# Patient Record
Sex: Male | Born: 1990 | Race: Black or African American | Hispanic: No | Marital: Single | Smoking: Former smoker
Health system: Southern US, Community
[De-identification: ages and names within clinical notes are randomized; demographics above are authoritative.]

## PROBLEM LIST (undated history)

## (undated) DIAGNOSIS — I10 Essential (primary) hypertension: Secondary | ICD-10-CM

## (undated) DIAGNOSIS — E119 Type 2 diabetes mellitus without complications: Secondary | ICD-10-CM

## (undated) HISTORY — PX: NO PAST SURGERIES: SHX2092

## (undated) HISTORY — DX: Type 2 diabetes mellitus without complications: E11.9

---
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
## 2010-01-03 ENCOUNTER — Encounter (INDEPENDENT_AMBULATORY_CARE_PROVIDER_SITE_OTHER): Payer: Self-pay | Admitting: *Deleted

## 2010-05-24 ENCOUNTER — Encounter (INDEPENDENT_AMBULATORY_CARE_PROVIDER_SITE_OTHER): Payer: Self-pay | Admitting: *Deleted

## 2010-09-21 NOTE — Letter (Signed)
Summary: Appointment - Reminder 2  [facility] at [place]. [address] [place] [address]   Phone: [phone]  Fax: [phone]     Jan 03, 2010 MRN: [id number]   Stanley Ramos [address] [facility] [address] [place], [place]  [address]   Dear [name],  Our records indicate that it is time to schedule a follow-up appointment.  Dr. Daleen Squibb         recommended that you follow up with [place] in    11/2009        . It is very important that we reach you to schedule this appointment. We look forward to participating in your health care needs. Please contact us at the number listed above at your earliest convenience to schedule your appointment.  If you are unable to make an appointment at this time, give [place] a call so we can update our records.     Sincerely,   Glass blower/designer

## 2010-09-21 NOTE — Letter (Signed)
Summary: Appointment - Reminder 2  [facility] at [place]. [address], [place] [address]   Phone: [phone]  Fax: [phone]     May 24, 2010 MRN: [id number]   RIGGINS CISEK [address] [facility] [address] [place], [place]  [address]   Dear Mr. HAZELRIGG,  Our records indicate that it is time to schedule a follow-up appointment.  Dr.  Daleen Squibb        recommended that you follow up with [place] in      11/2009      . It is very important that we reach you to schedule this appointment. We look forward to participating in your health care needs. Please contact us at the number listed above at your earliest convenience to schedule your appointment.  If you are unable to make an appointment at this time, give [place] a call so we can update our records.     Sincerely,   Glass blower/designer

## 2010-11-23 LAB — GLUCOSE, CAPILLARY: Glucose-Capillary: 214 mg/dL — ABNORMAL HIGH (ref 70–99)

## 2010-11-25 LAB — GLUCOSE, CAPILLARY
Glucose-Capillary: 130 mg/dL — ABNORMAL HIGH (ref 70–99)
Glucose-Capillary: 170 mg/dL — ABNORMAL HIGH (ref 70–99)
Glucose-Capillary: 171 mg/dL — ABNORMAL HIGH (ref 70–99)
Glucose-Capillary: 179 mg/dL — ABNORMAL HIGH (ref 70–99)
Glucose-Capillary: 182 mg/dL — ABNORMAL HIGH (ref 70–99)
Glucose-Capillary: 187 mg/dL — ABNORMAL HIGH (ref 70–99)
Glucose-Capillary: 194 mg/dL — ABNORMAL HIGH (ref 70–99)
Glucose-Capillary: 209 mg/dL — ABNORMAL HIGH (ref 70–99)
Glucose-Capillary: 232 mg/dL — ABNORMAL HIGH (ref 70–99)
Glucose-Capillary: 239 mg/dL — ABNORMAL HIGH (ref 70–99)
Glucose-Capillary: 267 mg/dL — ABNORMAL HIGH (ref 70–99)
Glucose-Capillary: 276 mg/dL — ABNORMAL HIGH (ref 70–99)
Glucose-Capillary: 285 mg/dL — ABNORMAL HIGH (ref 70–99)
Glucose-Capillary: 292 mg/dL — ABNORMAL HIGH (ref 70–99)
Glucose-Capillary: 312 mg/dL — ABNORMAL HIGH (ref 70–99)
Glucose-Capillary: 325 mg/dL — ABNORMAL HIGH (ref 70–99)
Glucose-Capillary: 331 mg/dL — ABNORMAL HIGH (ref 70–99)
Glucose-Capillary: 370 mg/dL — ABNORMAL HIGH (ref 70–99)

## 2010-11-25 LAB — CBC
HCT: 49.8 % — ABNORMAL HIGH (ref 36.0–49.0)
Hemoglobin: 17.3 g/dL — ABNORMAL HIGH (ref 12.0–16.0)
MCHC: 34.8 g/dL (ref 31.0–37.0)
MCV: 87.1 fL (ref 78.0–98.0)
Platelets: 222 K/uL (ref 150–400)
RBC: 5.72 MIL/uL — ABNORMAL HIGH (ref 3.80–5.70)
RDW: 12.8 % (ref 11.4–15.5)
WBC: 8 10*3/uL (ref 4.5–13.5)

## 2010-11-25 LAB — BASIC METABOLIC PANEL
BUN: 10 mg/dL (ref 6–23)
BUN: 11 mg/dL (ref 6–23)
BUN: 12 mg/dL (ref 6–23)
CO2: 32 mEq/L (ref 19–32)
Calcium: 9.4 mg/dL (ref 8.4–10.5)
Calcium: 9.6 mg/dL (ref 8.4–10.5)
Chloride: 97 mEq/L (ref 96–112)
Creatinine, Ser: 1.01 mg/dL (ref 0.4–1.5)
Glucose, Bld: 157 mg/dL — ABNORMAL HIGH (ref 70–99)
Glucose, Bld: 324 mg/dL — ABNORMAL HIGH (ref 70–99)
Potassium: 3.6 mEq/L (ref 3.5–5.1)
Potassium: 4.8 mEq/L (ref 3.5–5.1)
Sodium: 133 mEq/L — ABNORMAL LOW (ref 135–145)
Sodium: 138 mEq/L (ref 135–145)
Sodium: 143 mEq/L (ref 135–145)

## 2010-11-25 LAB — URINALYSIS, ROUTINE W REFLEX MICROSCOPIC
Bilirubin Urine: NEGATIVE
Glucose, UA: >1000 mg/dL — AB
Ketones, ur: 40 mg/dL — AB
Leukocytes, UA: NEGATIVE
Nitrite: NEGATIVE
Protein, ur: 100 mg/dL — AB
Specific Gravity, Urine: 1.02 (ref 1.005–1.030)
Urobilinogen, UA: 0.2 mg/dL (ref 0.0–1.0)
pH: 5.5 (ref 5.0–8.0)

## 2010-11-25 LAB — DIFFERENTIAL
Basophils Absolute: 0 K/uL (ref 0.0–0.1)
Basophils Relative: 0 % (ref 0–1)
Eosinophils Absolute: 0 K/uL (ref 0.0–1.2)
Eosinophils Relative: 0 % (ref 0–5)
Lymphocytes Relative: 23 % — ABNORMAL LOW (ref 24–48)
Lymphs Abs: 1.8 10*3/uL (ref 1.1–4.8)
Monocytes Absolute: 0.6 10*3/uL (ref 0.2–1.2)
Monocytes Relative: 8 % (ref 3–11)
Neutro Abs: 5.5 10*3/uL (ref 1.7–8.0)
Neutrophils Relative %: 69 % (ref 43–71)

## 2010-11-25 LAB — ETHANOL: Alcohol, Ethyl (B): <5 mg/dL (ref 0–10)

## 2010-11-25 LAB — RAPID URINE DRUG SCREEN, HOSP PERFORMED
Amphetamines: NOT DETECTED
Barbiturates: NOT DETECTED
Benzodiazepines: NOT DETECTED
Cocaine: NOT DETECTED
Opiates: NOT DETECTED
Tetrahydrocannabinol: NOT DETECTED

## 2010-11-25 LAB — BASIC METABOLIC PANEL WITH GFR
CO2: 25 meq/L (ref 19–32)
Calcium: 10.3 mg/dL (ref 8.4–10.5)
Creatinine, Ser: 1.23 mg/dL (ref 0.4–1.5)
Glucose, Bld: 494 mg/dL — ABNORMAL HIGH (ref 70–99)

## 2010-11-25 LAB — URINE MICROSCOPIC-ADD ON

## 2010-11-25 LAB — KETONES, QUALITATIVE
Acetone, Bld: NEGATIVE
Acetone, Bld: NEGATIVE

## 2010-11-25 LAB — HEMOGLOBIN A1C: Hgb A1c MFr Bld: 13.1 % — ABNORMAL HIGH (ref 4.6–6.1)

## 2010-11-25 LAB — CK: Total CK: 379 U/L — ABNORMAL HIGH (ref 7–232)

## 2011-01-02 NOTE — Assessment & Plan Note (Signed)
[facility]                       [place] [facility] OFFICE NOTE   Stanley Ramos, Stanley Ramos                     MRN:          [id number]  DATE:05/11/2009                            DOB:          1991-06-20    Stanley Ramos comes in today for followup.  He has lost an additional 2  pounds of total about 8-10 pounds for the last 6 weeks since we  initially evaluated him.  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   He recently saw Dr. Romero Belling of our Endocrinology Division of  [facility].  He is now monitoring his blood sugar much more closely, but  still he is running around 150-210.  His highest numbers appear to be in  the morning.   His blood pressure is under good control, but his heart rate still in  the 70s.   He is anxious to get back playing football, but I told him it was too  early.  We really need to see progressive improvement, not to mention  objective assessment of his LV systolic function improving, which  probably will take up to 3-6 months under the best of circumstances.   CURRENT MEDICATIONS:  1. Metformin 1000 mg p.o. b.i.d.  2. Lantus 50 units at bedtime.  3. Enalapril 20 mg p.o. daily.  4. Carvedilol 12.5 mg b.i.d.  5. NovoLog 70/30 70-30% suspension 70 units in the morning and 40      units in the evening.   He is intolerant of PENICILLIN.   He denies any orthopnea, PND, or peripheral edema.  His dyspnea on  exertion is stable.  He is not doing any lifting or heavy workouts.   His blood pressure today is 126/82, pulse 76 and regular, and he weighs  314.  HEENT:  Unchanged.  NECK:  No obvious JVD, but he has a thick neck.  LUNGS:  Clear to auscultation and percussion.  HEART:  Poorly appreciated PMIs, very muscular.  Normal S1 and S2.  No  gallop.  ABDOMEN:  Protuberant.  Good bowel sounds.  EXTREMITIES:  No edema.  Pulses are intact.  NEUROLOGIC:   Intact.   ASSESSMENT AND PLAN:  Lytle is slowly getting his risk factors under  control, but we can certainly do better with weight reduction not to  mention blood sugar control.  I am delighted he is seeing Dr. Everardo All.  He still receiving nutritional counseling at his school.  We have  increased his carvedilol to 25 b.i.d. for reduction of his resting heart  rate and for improving the chances of LV recovery.   Again, he really wants to play football, but I think it is way too early  to release him.  This is in his best interest in his healthcare future.   PLAN:  1. Increase carvedilol to 25 b.i.d.  2. Tighter him blood sugar control.  3. Lose 3-4 pounds per month.  4. Follow up with me end of November.  We will obtain a 2-D      echocardiogram prior to that visit.  Hopefully, his LV has  recovered substantially.  If that is the case, we will release him      to the begin working out and getting him conditioned to play sports      in the near future.     Thomas C. Daleen Squibb, MD, [facility]  Electronically Signed    TCW/MedQ  DD: 05/11/2009  DT: 05/12/2009  Job #: [id number]   cc:   Lorin Picket A. Gerda Diss, MD

## 2011-01-02 NOTE — H&P (Signed)
NAME:  Stanley Ramos, Stanley Ramos              ACCOUNT NO.:  [id number]   MEDICAL RECORD NO.:  [id number]          PATIENT TYPE:  OBV   LOCATION:  A334                          FACILITY:  [facility]   PHYSICIAN:  Scott A. Gerda Diss, MD    DATE OF BIRTH:  [birth year]   DATE OF ADMISSION:  03/21/2009  DATE OF DISCHARGE:  LH                              HISTORY & PHYSICAL   CHIEF COMPLAINT:  Syncope.   HISTORY OF PRESENT ILLNESS:  This 20 year old was practicing football.  He is a known diabetic, diagnosed in 2004.  He is on insulin and was in  the huddle, felt hot, felt nauseous and dizzy and then passed out.  He  was brought to the emergency department, his sugars were in the 400s.  Head scan was done which was negative.  Blood work including ketones was  done, essentially showed a little  hemoconcentration along with  increased urine specific gravity along with glucoses in the 400s.  He  was treated with IV insulin, also treated with IV fluids and was  recommended to be admitted.   PAST MEDICAL HISTORY:  Normal birth history.  Up-to-date on shots.  Diagnosed in 2004 with diabetes.  Apparently was about 300 pounds at  that time; has lost some weight and he is grown a lot in terms of  height.  He plays football on a regular basis, entering into 11th grade.  He stays at home with his mom who is also diabetic.   MEDICATIONS:  Lantus 50 units at night, 30 units of 70/30 in the morning  and sliding scale at meal times, metformin 1000 mg b.i.d.,  as well as  enalapril.   FAMILY HISTORY:  Diabetes.   REVIEW OF SYSTEMS:  See per above.  Denies any chest pain, shortness of  breath, previous syncope.   PHYSICAL EXAMINATION:  HEENT:  NLT, NL.  NECK:  No masses.  CHEST:  CTA.  HEART: Regular.  ABDOMEN: Soft.  EXTREMITIES:  No edema.   CT scan reviewed, x-rays were reviewed, lab work was reviewed.  Lab work  did not show any serum ketones.  Drug screen was negative.  Urine  microscopic showed a specific  gravity 1.020, 40 of ketones, urine  glucose greater than 1000.  EtOH level negative.  BMET:  494 glucose,  sodium 133.  White count 8000.   ASSESSMENT AND PLAN:  Hyperglycemia with syncope.  I think that is the  patient probably got overheated that caused the patient to pass out and  cause the numbers go up dramatically.  I do think that it would be best  for this patient to be admitted, given IV fluids and monitored closely.  Will consider doing an echo as well.      Scott A. Gerda Diss, MD  Electronically Signed     SAL/MEDQ  D:  03/22/2009  T:  03/22/2009  Job:  [id number]

## 2011-01-02 NOTE — Group Therapy Note (Signed)
NAME:  Stanley Ramos, Stanley Ramos              ACCOUNT NO.:  [id number]   MEDICAL RECORD NO.:  [id number]          PATIENT TYPE:  OBV   LOCATION:  A334                          FACILITY:  [facility]   PHYSICIAN:  Scott A. Gerda Diss, MD    DATE OF BIRTH:  November 18, 1990   DATE OF PROCEDURE:  DATE OF DISCHARGE:  03/22/2009                                 PROGRESS NOTE   At 8:00 a.m., the patient was more alert with feeling better.  His  glucoses are now stabilized.  We will bring him off the insulin drip,  place him on 30 units of Lantus in a sliding scale and get him back  toward his usual dietary intake.  In addition to this, we will go ahead  with the patient have an echo done and if he is doing well this  afternoon, I think we will let him go home.  I did advise him not only a  followup in [facility], but have them set him up with  endocrinologist mainly because with him playing competitive football, I  really think he has to start getting in to checking his sugars before  practice and if it is a long practice or if he starts feeling bad, check  it during practice and check it afterwards.  I want him to try to  maintain his glycemic control as possible and also keep well hydrated.  He is not to do any contact football for the rest of this week and would  not be released to contact football until Monday next week as long as he  has a good rest of the week.  He understands this.  He may do some  conditioning this week as he starts to feel better.      Scott A. Gerda Diss, MD  Electronically Signed     SAL/MEDQ  D:  03/22/2009  T:  03/22/2009  Job:  [id number]

## 2011-02-27 ENCOUNTER — Encounter: Payer: Self-pay | Admitting: Cardiology

## 2011-09-18 ENCOUNTER — Emergency Department (HOSPITAL_COMMUNITY)
Admission: EM | Admit: 2011-09-18 | Discharge: 2011-09-18 | Disposition: A | Discharge status: Home / Self Care | Payer: Self-pay | Attending: Emergency Medicine | Admitting: Emergency Medicine

## 2011-09-18 DIAGNOSIS — Z794 Long term (current) use of insulin: Secondary | ICD-10-CM | POA: Insufficient documentation

## 2011-09-18 DIAGNOSIS — IMO0002 Reserved for concepts with insufficient information to code with codable children: Secondary | ICD-10-CM

## 2011-09-18 DIAGNOSIS — L03019 Cellulitis of unspecified finger: Secondary | ICD-10-CM | POA: Insufficient documentation

## 2011-09-18 DIAGNOSIS — E119 Type 2 diabetes mellitus without complications: Secondary | ICD-10-CM | POA: Insufficient documentation

## 2011-09-18 MED ORDER — CEPHALEXIN 500 MG PO CAPS: 500.0000 mg | ORAL_CAPSULE | Freq: Four times a day (QID) | ORAL | Status: AC

## 2011-09-18 NOTE — ED Notes (Signed)
Pt reports redness /pain and slight swelling to left third finger around nail

## 2011-09-18 NOTE — ED Notes (Signed)
Pt alert & oriented x4, stable gait. Pt given discharge instructions, paperwork & prescription(s), pt verbalized understanding. Pt left department w/ no further questions.  

## 2011-09-19 NOTE — ED Provider Notes (Addendum)
History     CSN: [id number]  Arrival date & time 09/18/11  2124   First MD Initiated Contact with Patient 09/18/11 2147      Chief Complaint  Patient presents with  . Hand Pain    (Consider location/radiation/quality/duration/timing/severity/associated sxs/prior treatment) Patient is a 21 y.o. male presenting with hand pain. The history is provided by the patient.  Hand Pain This is a new problem. The current episode started 2 days ago. The problem occurs constantly. The problem has been gradually worsening. Exacerbated by: palpation. He has tried nothing for the symptoms.    Past Medical History  Diagnosis Date  . DM (diabetes mellitus)     No past surgical history on file.  Family History  Problem Relation Age of Onset  . Diabetes      family hx (not immediate) of DM     History  Substance Use Topics  . Smoking status: Never Smoker   . Smokeless tobacco: Not on file  . Alcohol Use: No      Review of Systems  All other systems reviewed and are negative.    Allergies  Penicillins  Home Medications   Current Outpatient Rx  Name Route Sig Dispense Refill  . CARVEDILOL 25 MG PO TABS Oral Take 25 mg by mouth 2 (two) times daily.      . ENALAPRIL MALEATE 20 MG PO TABS Oral Take 20 mg by mouth daily.      . INSULIN ASPART PROT & ASPART (70-30) 100 UNIT/ML [place] SUSP Subcutaneous Inject into the skin. 70 units in am and 45 units pm     . INSULIN GLARGINE 100 UNIT/ML  SOLN Subcutaneous Inject 50-55 Units into the skin at bedtime.     [place] Kitchen METFORMIN HCL 1000 MG PO TABS Oral Take 1,000 mg by mouth 2 (two) times daily.      . CEPHALEXIN 500 MG PO CAPS Oral Take 1 capsule (500 mg total) by mouth 4 (four) times daily. 28 capsule 0    BP 164/98  Pulse 97  Temp(Src) 98.2 F (36.8 C) (Oral)  Resp 20  Ht 6\' 3"  (1.905 m)  Wt 310 lb (140.615 kg)  BMI 38.75 kg/m2  SpO2 98%  Physical Exam  Nursing note and vitals reviewed. Constitutional: He is oriented to person,  place, and time. He appears well-developed and well-nourished.  HENT:  Head: Normocephalic and atraumatic.  Neck: Normal range of motion. Neck supple.  Musculoskeletal:        There is swelling, erythema of the base of the nail of the left third finger.  Neurological: He is alert and oriented to person, place, and time.  Skin: Skin is warm and dry.    ED Course  Procedures (including critical care time)  Labs Reviewed - No data to display No results found.   1. Paronychia       MDM  Will prescribe keflex, frequent hot soaks.          Geoffery Lyons, MD 09/19/11 4098  Geoffery Lyons, MD 10/09/11 1540

## 2012-06-18 ENCOUNTER — Emergency Department (HOSPITAL_COMMUNITY): Payer: No Typology Code available for payment source

## 2012-06-18 ENCOUNTER — Observation Stay (HOSPITAL_COMMUNITY)
Admission: EM | Admit: 2012-06-18 | Discharge: 2012-06-20 | Disposition: A | Discharge status: Home / Self Care | Payer: No Typology Code available for payment source | Attending: General Surgery | Admitting: General Surgery

## 2012-06-18 ENCOUNTER — Encounter (HOSPITAL_COMMUNITY): Payer: Self-pay | Admitting: *Deleted

## 2012-06-18 DIAGNOSIS — IMO0002 Reserved for concepts with insufficient information to code with codable children: Secondary | ICD-10-CM | POA: Insufficient documentation

## 2012-06-18 DIAGNOSIS — E119 Type 2 diabetes mellitus without complications: Secondary | ICD-10-CM | POA: Insufficient documentation

## 2012-06-18 DIAGNOSIS — S27321A Contusion of lung, unilateral, initial encounter: Secondary | ICD-10-CM

## 2012-06-18 DIAGNOSIS — I10 Essential (primary) hypertension: Secondary | ICD-10-CM | POA: Insufficient documentation

## 2012-06-18 DIAGNOSIS — S27329A Contusion of lung, unspecified, initial encounter: Principal | ICD-10-CM | POA: Insufficient documentation

## 2012-06-18 DIAGNOSIS — T07XXXA Unspecified multiple injuries, initial encounter: Secondary | ICD-10-CM | POA: Diagnosis present

## 2012-06-18 HISTORY — DX: Type 2 diabetes mellitus without complications: E11.9

## 2012-06-18 HISTORY — DX: Essential (primary) hypertension: I10

## 2012-06-18 LAB — URINALYSIS, MICROSCOPIC ONLY
Leukocytes, UA: NEGATIVE
Protein, ur: 100 mg/dL — AB
Urobilinogen, UA: 0.2 mg/dL (ref 0.0–1.0)

## 2012-06-18 LAB — COMPREHENSIVE METABOLIC PANEL
ALT: 85 U/L — ABNORMAL HIGH (ref 0–53)
Alkaline Phosphatase: 95 U/L (ref 39–117)
CO2: 24 mEq/L (ref 19–32)
GFR calc Af Amer: >90 mL/min (ref >90)
GFR calc non Af Amer: >90 mL/min (ref >90)
Glucose, Bld: 334 mg/dL — ABNORMAL HIGH (ref 70–99)
Potassium: 3.6 mEq/L (ref 3.5–5.1)
Sodium: 131 mEq/L — ABNORMAL LOW (ref 135–145)
Total Bilirubin: 0.5 mg/dL (ref 0.3–1.2)

## 2012-06-18 LAB — CREATININE, SERUM
Creatinine, Ser: 0.94 mg/dL (ref 0.50–1.35)
GFR calc Af Amer: >90 mL/min (ref >90)
GFR calc non Af Amer: >90 mL/min (ref >90)

## 2012-06-18 LAB — TYPE AND SCREEN
ABO/RH(D): A POS
Antibody Screen: NEGATIVE

## 2012-06-18 LAB — POCT I-STAT, CHEM 8
BUN: 14 mg/dL (ref 6–23)
Sodium: 139 mEq/L (ref 135–145)
TCO2: 22 mmol/L (ref 0–100)

## 2012-06-18 LAB — GLUCOSE, CAPILLARY

## 2012-06-18 LAB — ABO/RH: ABO/RH(D): A POS

## 2012-06-18 LAB — CBC
MCHC: 35.4 g/dL (ref 30.0–36.0)
MCV: 89.5 fL (ref 78.0–100.0)
Platelets: 213 10*3/uL (ref 150–400)
RDW: 12.2 % (ref 11.5–15.5)
RDW: 12.4 % (ref 11.5–15.5)
WBC: 13.4 10*3/uL — ABNORMAL HIGH (ref 4.0–10.5)

## 2012-06-18 LAB — PROTIME-INR: INR: 1 (ref 0.00–1.49)

## 2012-06-18 MED ORDER — DOCUSATE SODIUM 100 MG PO CAPS
100.0000 mg | ORAL_CAPSULE | Freq: Two times a day (BID) | ORAL | Status: DC
  Administered 2012-06-18 – 2012-06-19 (×3): 100 mg via ORAL
  Filled 2012-06-18 (×4): qty 1

## 2012-06-18 MED ORDER — ONDANSETRON HCL 4 MG PO TABS: 4.0000 mg | ORAL_TABLET | Freq: Four times a day (QID) | ORAL | Status: DC | PRN

## 2012-06-18 MED ORDER — OXYCODONE HCL 5 MG PO TABS: 2.5000 mg | ORAL_TABLET | ORAL | Status: DC | PRN

## 2012-06-18 MED ORDER — BACITRACIN ZINC 500 UNIT/GM EX OINT
TOPICAL_OINTMENT | Freq: Two times a day (BID) | CUTANEOUS | Status: DC
  Administered 2012-06-18 – 2012-06-19 (×3): via TOPICAL
  Filled 2012-06-18: qty 15

## 2012-06-18 MED ORDER — POTASSIUM CHLORIDE IN NACL 20-0.45 MEQ/L-% IV SOLN
INTRAVENOUS | Status: DC
  Administered 2012-06-18 – 2012-06-19 (×2): via INTRAVENOUS
  Filled 2012-06-18 (×5): qty 1000

## 2012-06-18 MED ORDER — INSULIN ASPART 100 UNIT/ML ~~LOC~~ SOLN
0.0000 [IU] | Freq: Three times a day (TID) | SUBCUTANEOUS | Status: DC
  Administered 2012-06-18: 7 [IU] via SUBCUTANEOUS
  Administered 2012-06-18: 11 [IU] via SUBCUTANEOUS
  Administered 2012-06-19: 7 [IU] via SUBCUTANEOUS
  Administered 2012-06-19 (×2): 4 [IU] via SUBCUTANEOUS
  Administered 2012-06-20: 7 [IU] via SUBCUTANEOUS

## 2012-06-18 MED ORDER — ENOXAPARIN SODIUM 40 MG/0.4ML ~~LOC~~ SOLN
40.0000 mg | SUBCUTANEOUS | Status: DC
  Administered 2012-06-18 – 2012-06-19 (×2): 40 mg via SUBCUTANEOUS
  Filled 2012-06-18 (×3): qty 0.4

## 2012-06-18 MED ORDER — HYDROMORPHONE HCL PF 1 MG/ML IJ SOLN
1.0000 mg | INTRAMUSCULAR | Status: DC | PRN
  Administered 2012-06-18: 1 mg via INTRAVENOUS
  Filled 2012-06-18: qty 1

## 2012-06-18 MED ORDER — INSULIN ASPART 100 UNIT/ML ~~LOC~~ SOLN
0.0000 [IU] | Freq: Every day | SUBCUTANEOUS | Status: DC
  Administered 2012-06-18: 3 [IU] via SUBCUTANEOUS

## 2012-06-18 MED ORDER — IOHEXOL 300 MG/ML  SOLN
100.0000 mL | Freq: Once | INTRAMUSCULAR | Status: AC | PRN
  Administered 2012-06-18: 100 mL via INTRAVENOUS

## 2012-06-18 MED ORDER — PANTOPRAZOLE SODIUM 40 MG IV SOLR
40.0000 mg | Freq: Every day | INTRAVENOUS | Status: DC
  Filled 2012-06-18 (×2): qty 40

## 2012-06-18 MED ORDER — SODIUM CHLORIDE 0.9 % IV SOLN: Freq: Once | INTRAVENOUS | Status: DC

## 2012-06-18 MED ORDER — INSULIN GLARGINE 100 UNIT/ML ~~LOC~~ SOLN
40.0000 [IU] | Freq: Every day | SUBCUTANEOUS | Status: DC
  Administered 2012-06-18 – 2012-06-19 (×2): 40 [IU] via SUBCUTANEOUS

## 2012-06-18 MED ORDER — OXYCODONE HCL 5 MG PO TABS
10.0000 mg | ORAL_TABLET | ORAL | Status: DC | PRN
  Administered 2012-06-18 – 2012-06-19 (×2): 10 mg via ORAL
  Filled 2012-06-18 (×2): qty 2

## 2012-06-18 MED ORDER — ONDANSETRON HCL 4 MG/2ML IJ SOLN: 4.0000 mg | Freq: Four times a day (QID) | INTRAMUSCULAR | Status: DC | PRN

## 2012-06-18 MED ORDER — HYDROMORPHONE HCL PF 1 MG/ML IJ SOLN
1.0000 mg | INTRAMUSCULAR | Status: DC | PRN
  Administered 2012-06-18 (×2): 2 mg via INTRAVENOUS
  Administered 2012-06-19: 1 mg via INTRAVENOUS
  Filled 2012-06-18 (×3): qty 1
  Filled 2012-06-18: qty 2

## 2012-06-18 MED ORDER — TETANUS-DIPHTH-ACELL PERTUSSIS 5-2.5-18.5 LF-MCG/0.5 IM SUSP
0.5000 mL | Freq: Once | INTRAMUSCULAR | Status: AC
  Administered 2012-06-18: 0.5 mL via INTRAMUSCULAR
  Filled 2012-06-18: qty 0.5

## 2012-06-18 MED ORDER — PANTOPRAZOLE SODIUM 40 MG PO TBEC
40.0000 mg | DELAYED_RELEASE_TABLET | Freq: Every day | ORAL | Status: DC
  Administered 2012-06-18: 40 mg via ORAL
  Filled 2012-06-18: qty 1

## 2012-06-18 MED ORDER — OXYCODONE HCL 5 MG PO TABS: 5.0000 mg | ORAL_TABLET | ORAL | Status: DC | PRN

## 2012-06-18 NOTE — Progress Notes (Signed)
UR complete 

## 2012-06-18 NOTE — ED Notes (Signed)
Pt returned from CT °

## 2012-06-18 NOTE — ED Notes (Signed)
Pt arrived to CT, trauma MD at bedside.
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
## 2012-06-18 NOTE — Progress Notes (Signed)
Patient looking very uncomfortable.  Lots of pain.  Diaphoretic and complaining of lots of of pain on his right side.  Will get PT consultation, IV pain medications, and incentive spirometer.  Marta Lamas. Gae Bon, MD, FACS [phone] Trauma Surgeon

## 2012-06-18 NOTE — ED Notes (Signed)
EDP and Trauma MD at bedside to asses pt.

## 2012-06-18 NOTE — ED Notes (Signed)
Report called to floor. Pt remains alert and oriented, family at bedside
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
## 2012-06-18 NOTE — ED Notes (Signed)
Abrasions to right upper arm, right lower leg, left knee and left elbow cleaned with normal saline and antibiotic ointment applied and wrapped covered with sterile dresssings and kerlix

## 2012-06-18 NOTE — ED Notes (Signed)
Family at the bedside.
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
## 2012-06-19 ENCOUNTER — Observation Stay (HOSPITAL_COMMUNITY): Payer: No Typology Code available for payment source

## 2012-06-19 DIAGNOSIS — E119 Type 2 diabetes mellitus without complications: Secondary | ICD-10-CM | POA: Insufficient documentation

## 2012-06-19 DIAGNOSIS — I10 Essential (primary) hypertension: Secondary | ICD-10-CM | POA: Insufficient documentation

## 2012-06-19 LAB — URINE CULTURE: Colony Count: >=100000

## 2012-06-19 LAB — GLUCOSE, CAPILLARY
Glucose-Capillary: 184 mg/dL — ABNORMAL HIGH (ref 70–99)
Glucose-Capillary: 168 mg/dL — ABNORMAL HIGH (ref 70–99)
Glucose-Capillary: 176 mg/dL — ABNORMAL HIGH (ref 70–99)

## 2012-06-19 MED ORDER — OXYCODONE-ACETAMINOPHEN 10-325 MG PO TABS: 1.0000 | ORAL_TABLET | ORAL | Status: AC | PRN

## 2012-06-19 MED ORDER — HYDROMORPHONE HCL PF 1 MG/ML IJ SOLN: 0.5000 mg | INTRAMUSCULAR | Status: DC | PRN

## 2012-06-19 MED ORDER — NAPROXEN 500 MG PO TABS: 500.0000 mg | ORAL_TABLET | Freq: Two times a day (BID) | ORAL | Status: DC

## 2012-06-19 MED ORDER — NAPROXEN 500 MG PO TABS
500.0000 mg | ORAL_TABLET | Freq: Two times a day (BID) | ORAL | Status: DC
  Administered 2012-06-19 – 2012-06-20 (×2): 500 mg via ORAL
  Filled 2012-06-19 (×4): qty 1

## 2012-06-19 MED ORDER — OXYCODONE HCL 5 MG PO TABS
5.0000 mg | ORAL_TABLET | ORAL | Status: DC | PRN
  Administered 2012-06-19: 15 mg via ORAL
  Administered 2012-06-19 – 2012-06-20 (×2): 10 mg via ORAL
  Filled 2012-06-19: qty 2
  Filled 2012-06-19: qty 1
  Filled 2012-06-19: qty 3
  Filled 2012-06-19: qty 1

## 2012-06-19 NOTE — Discharge Summary (Signed)
Desiree Daise, MD, MPH, FACS Pager: [phone]  
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
## 2012-06-19 NOTE — Progress Notes (Signed)
Up walking in room but still quite sore.  Will see how therapies go today and likely plan D/C this PM. Patient examined and I agree with the assessment and plan  Violeta Gelinas, MD, MPH, FACS Pager: [phone]  06/19/2012 9:47 AM

## 2012-06-19 NOTE — Progress Notes (Signed)
Inpatient Diabetes Program Recommendations  AACE/ADA: New Consensus Statement on Inpatient Glycemic Control (2013)  Target Ranges:  Prepandial:   less than 140 mg/dL      Peak postprandial:   less than 180 mg/dL (1-2 hours)      Critically ill patients:  140 - 180 mg/dL   Reason for Visit: Hyperglycemia  Inpatient Diabetes Program Recommendations Insulin - Basal: If not ordering any home 70/30, would suggest tht Lantus be increased to at least home dose of50 units at HS Insulin - Meal Coverage: If not going to use 70/30, please add meal covearge 4 -6  units tidwc  Note: Thank you, Lenor Coffin, RN, CNS, Diabetes Coordinator [phone])

## 2012-06-20 ENCOUNTER — Encounter: Payer: Self-pay | Admitting: Cardiology

## 2012-06-20 LAB — GLUCOSE, CAPILLARY: Glucose-Capillary: 240 mg/dL — ABNORMAL HIGH (ref 70–99)

## 2012-06-20 NOTE — Progress Notes (Signed)
Discharge instructions/Med Rec Sheet reviewed w/ pt. Pt expressed understanding and copies given w/ prescriptions. Pt d/c'd in stable condition via w/c, accompanied by discharge volunteers 

## 2012-06-23 ENCOUNTER — Encounter (INDEPENDENT_AMBULATORY_CARE_PROVIDER_SITE_OTHER): Payer: Self-pay | Admitting: Orthopedic Surgery

## 2012-06-23 ENCOUNTER — Telehealth (HOSPITAL_COMMUNITY): Payer: Self-pay | Admitting: Orthopedic Surgery

## 2012-06-23 NOTE — Telephone Encounter (Signed)
Patient called for letter to return to work. He still cannot move his right shoulder but needs to return to work for the money. He is trying to go to PT but lacks the money for that. After some discussion I agreed to write a return to work note with the observation that he currently can't use his right arm and will leave it to his employer to decide if he can come back with that restriction.
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
## 2013-09-01 ENCOUNTER — Encounter: Payer: Self-pay | Admitting: Family Medicine

## 2013-09-01 ENCOUNTER — Ambulatory Visit (INDEPENDENT_AMBULATORY_CARE_PROVIDER_SITE_OTHER): Payer: 59 | Admitting: Family Medicine

## 2013-09-01 VITALS — BP 130/84 | Ht 75.0 in | Wt 312.0 lb

## 2013-09-01 DIAGNOSIS — E119 Type 2 diabetes mellitus without complications: Secondary | ICD-10-CM

## 2013-09-01 DIAGNOSIS — Z79899 Other long term (current) drug therapy: Secondary | ICD-10-CM

## 2013-09-01 DIAGNOSIS — Z0000 Encounter for general adult medical examination without abnormal findings: Secondary | ICD-10-CM

## 2013-09-01 DIAGNOSIS — I10 Essential (primary) hypertension: Secondary | ICD-10-CM

## 2013-09-01 LAB — POCT GLYCOSYLATED HEMOGLOBIN (HGB A1C): HEMOGLOBIN A1C: 8.3

## 2013-09-01 MED ORDER — INSULIN ASPART PROT & ASPART (70-30 MIX) 100 UNIT/ML ~~LOC~~ SUSP: 10.0000 [IU] | Freq: Three times a day (TID) | SUBCUTANEOUS | Status: DC

## 2013-09-01 MED ORDER — INSULIN GLARGINE 100 UNIT/ML ~~LOC~~ SOLN: 50.0000 [IU] | Freq: Every day | SUBCUTANEOUS | Status: DC

## 2013-09-01 MED ORDER — METFORMIN HCL 1000 MG PO TABS: 1000.0000 mg | ORAL_TABLET | Freq: Two times a day (BID) | ORAL | Status: DC

## 2013-09-01 MED ORDER — ENALAPRIL MALEATE 10 MG PO TABS: 10.0000 mg | ORAL_TABLET | Freq: Every day | ORAL | Status: DC

## 2013-09-01 NOTE — Progress Notes (Signed)
   Subjective:    Patient ID: Stanley Ramos, male    DOB: December 03, 1990, 23 y.o.   MRN: [id number]  HPI Patient is here today for a physical. He has not been here in over a year. He states he stopped using his medications over the past several months a separate insulin but now he started to use his medicines again he needs refills 7.  He has a new insurance and would like to start coming back here for diabetic checkups.  He needs a DOT form filled out. [facility] there is no reason he cannot drive. Form was filled out without difficulty. Yearly examinations recommended.    Review of Systems  Constitutional: Negative for fever, activity change and appetite change.  HENT: Negative for congestion and rhinorrhea.   Eyes: Negative for discharge.  Respiratory: Negative for cough and wheezing.   Cardiovascular: Negative for chest pain.  Gastrointestinal: Negative for vomiting, abdominal pain and blood in stool.  Genitourinary: Negative for frequency and difficulty urinating.  Musculoskeletal: Negative for neck pain.  Skin: Negative for rash.  Allergic/Immunologic: Negative for environmental allergies and food allergies.  Neurological: Negative for weakness and headaches.  Psychiatric/Behavioral: Negative for agitation.       Objective:   Physical Exam  Constitutional: He appears well-developed and well-nourished.  HENT:  Head: Normocephalic and atraumatic.  Right Ear: External ear normal.  Left Ear: External ear normal.  Nose: Nose normal.  Mouth/Throat: Oropharynx is clear and moist.  Eyes: EOM are normal. Pupils are equal, round, and reactive to light.  Neck: Normal range of motion. Neck supple. No thyromegaly present.  Cardiovascular: Normal rate, regular rhythm and normal heart sounds.   No murmur heard. Pulmonary/Chest: Effort normal and breath sounds normal. No respiratory distress. He has no wheezes.  Abdominal: Soft. Bowel sounds are  normal. He exhibits no distension and no mass. There is no tenderness.  Genitourinary: Penis normal.  Musculoskeletal: Normal range of motion. He exhibits no edema.  Lymphadenopathy:    He has no cervical adenopathy.  Neurological: He is alert. He exhibits normal muscle tone.  Skin: Skin is warm and dry. No erythema.  Psychiatric: He has a normal mood and affect. His behavior is normal. Judgment normal.          Assessment & Plan:  Wellness-safety measures dietary measures discussed the importance trying to lose weight watch diet closely  #2 diabetes subpar control the importance of getting back on medicines. NovoLog 10-15 units per meal plus Lantus long-acting insulin 50 units titrate up toward 80 try to get fasting glucose down to 110-120. Importance of getting back on other medicines as well. These were sent in. He'll followup in 3-4 months to recheck A1c. If not getting down towards 7.0 eventually then he will need referral to endocrinology. This was offered to the patient. He would like to followup here and initially because of cost  This patient has not been on beta blockers over the past several years I don't recommend reinitiating them at this time. Lab work was ordered we await the results patient works third shift which makes controlling diabetes challenging but he is motivated to get it under better control

## 2013-09-15 ENCOUNTER — Emergency Department (HOSPITAL_COMMUNITY): Payer: 59

## 2013-09-15 ENCOUNTER — Encounter (HOSPITAL_COMMUNITY): Payer: Self-pay | Admitting: Emergency Medicine

## 2013-09-15 ENCOUNTER — Emergency Department (HOSPITAL_COMMUNITY)
Admission: EM | Admit: 2013-09-15 | Discharge: 2013-09-16 | Disposition: A | Discharge status: Home / Self Care | Payer: 59 | Attending: Emergency Medicine | Admitting: Emergency Medicine

## 2013-09-15 DIAGNOSIS — Y92838 Other recreation area as the place of occurrence of the external cause: Secondary | ICD-10-CM

## 2013-09-15 DIAGNOSIS — S99929A Unspecified injury of unspecified foot, initial encounter: Principal | ICD-10-CM

## 2013-09-15 DIAGNOSIS — Y9367 Activity, basketball: Secondary | ICD-10-CM | POA: Insufficient documentation

## 2013-09-15 DIAGNOSIS — Z880 Allergy status to penicillin: Secondary | ICD-10-CM | POA: Insufficient documentation

## 2013-09-15 DIAGNOSIS — I10 Essential (primary) hypertension: Secondary | ICD-10-CM | POA: Insufficient documentation

## 2013-09-15 DIAGNOSIS — S93336A Other dislocation of unspecified foot, initial encounter: Secondary | ICD-10-CM | POA: Insufficient documentation

## 2013-09-15 DIAGNOSIS — S8990XA Unspecified injury of unspecified lower leg, initial encounter: Secondary | ICD-10-CM | POA: Insufficient documentation

## 2013-09-15 DIAGNOSIS — Z87891 Personal history of nicotine dependence: Secondary | ICD-10-CM | POA: Insufficient documentation

## 2013-09-15 DIAGNOSIS — Z79899 Other long term (current) drug therapy: Secondary | ICD-10-CM | POA: Insufficient documentation

## 2013-09-15 DIAGNOSIS — X58XXXA Exposure to other specified factors, initial encounter: Secondary | ICD-10-CM | POA: Insufficient documentation

## 2013-09-15 DIAGNOSIS — M79671 Pain in right foot: Secondary | ICD-10-CM

## 2013-09-15 DIAGNOSIS — Y9239 Other specified sports and athletic area as the place of occurrence of the external cause: Secondary | ICD-10-CM | POA: Insufficient documentation

## 2013-09-15 DIAGNOSIS — S99919A Unspecified injury of unspecified ankle, initial encounter: Principal | ICD-10-CM

## 2013-09-15 DIAGNOSIS — E119 Type 2 diabetes mellitus without complications: Secondary | ICD-10-CM | POA: Insufficient documentation

## 2013-09-15 DIAGNOSIS — Z794 Long term (current) use of insulin: Secondary | ICD-10-CM | POA: Insufficient documentation

## 2013-09-15 LAB — GLUCOSE, CAPILLARY: GLUCOSE-CAPILLARY: 236 mg/dL — AB (ref 70–99)

## 2013-09-15 MED ORDER — NAPROXEN 250 MG PO TABS
500.0000 mg | ORAL_TABLET | Freq: Once | ORAL | Status: AC
  Administered 2013-09-15: 500 mg via ORAL
  Filled 2013-09-15: qty 2

## 2013-09-15 NOTE — ED Provider Notes (Signed)
CSN: [id number]     Arrival date & time 09/15/13  2054 History   First MD Initiated Contact with Patient 09/15/13 2259     Chief Complaint  Patient presents with  . Foot Pain   (Consider location/radiation/quality/duration/timing/severity/associated sxs/prior Treatment) HPI Comments: 23 year old male who is obese, very tall and also has diabetes and hypertension. He presents from home with the complaint of right foot pain. The pain started approximately 6 days ago, it started the day after he had played basketball when he awoke the next morning he had pain to the plantar aspect of his right foot towards the lateral aspect of the foot. This is worse with ambulation and associated with mild swelling and discoloration of the plantar aspect of the foot. He denies fevers chills nausea or vomiting. He has never had gout, never had diabetic infections, denies stepping on foreign bodies and denies any discrete identifiable injury  Patient is a 23 y.o. male presenting with lower extremity pain. The history is provided by the patient.  Foot Pain    Past Medical History  Diagnosis Date  . DM (diabetes mellitus)   . Diabetes mellitus without complication   . Hypertension    Past Surgical History  Procedure Laterality Date  . No past surgeries     Family History  Problem Relation Age of Onset  . Diabetes      family hx (not immediate) of DM    History  Substance Use Topics  . Smoking status: Former Smoker -- .5 years    Types: Cigarettes    Quit date: 09/02/2011  . Smokeless tobacco: Never Used  . Alcohol Use: Yes     Comment: social    Review of Systems  Constitutional: Negative for fever.  Skin: Positive for color change. Negative for rash and wound.    Allergies  Penicillins  Home Medications   Current Outpatient Rx  Name  Route  Sig  Dispense  Refill  . enalapril (VASOTEC) 10 MG tablet   Oral   Take 1 tablet (10 mg total) by mouth daily.   30 tablet   5   . ibuprofen  (ADVIL,MOTRIN) 200 MG tablet   Oral   Take 800 mg by mouth 2 (two) times daily as needed for moderate pain.         Marland Kitchen. insulin aspart protamine- aspart (NOVOLOG MIX 70/30) (70-30) 100 UNIT/ML injection   Subcutaneous   Inject 0.1-0.2 mLs (10-20 Units total) into the skin 3 (three) times daily with meals. Sliding scale   10 mL   3   . insulin aspart protamine-insulin aspart (NOVOLOG MIX 70/30) (70-30) 100 UNIT/ML injection   Subcutaneous   Inject into the skin. 70 units in am and 45 units pm          . insulin glargine (LANTUS) 100 UNIT/ML injection   Subcutaneous   Inject 0.5 mLs (50 Units total) into the skin at bedtime. May titrate up to 20 units daily   10 mL   3   . metFORMIN (GLUCOPHAGE) 1000 MG tablet   Oral   Take 1 tablet (1,000 mg total) by mouth 2 (two) times daily.   60 tablet   5   . naproxen (NAPROSYN) 500 MG tablet   Oral   Take 1 tablet (500 mg total) by mouth 2 (two) times daily with a meal.   30 tablet   0   . sulfamethoxazole-trimethoprim (SEPTRA DS) 800-160 MG per tablet   Oral   Take  1 tablet by mouth every 12 (twelve) hours.   20 tablet   0    BP 155/91  Pulse 102  Temp(Src) 98.4 F (36.9 C) (Oral)  Resp 20  Ht 6\' 3"  (1.905 m)  Wt 311 lb (141.069 kg)  BMI 38.87 kg/m2  SpO2 99% Physical Exam  Nursing note and vitals reviewed. Constitutional: He appears well-developed and well-nourished. No distress.  HENT:  Head: Normocephalic and atraumatic.  Eyes: Conjunctivae are normal. No scleral icterus.  Cardiovascular: Normal rate, regular rhythm and intact distal pulses.   Pulmonary/Chest: Effort normal and breath sounds normal.  Musculoskeletal: He exhibits tenderness ( Tentative palpation over the plantar aspect of the right foot towards the lateral surface. This is in the same location of a large callous, there is some purple discoloration of the skin in the middle of the plantar area, he has normal range of motion). He exhibits no edema.  No  tenderness to palpation over the dorsum of the foot or the base of the fifth metatarsal  Neurological: He is alert.  Thomas sensation to the right foot  Skin: Skin is warm and dry. Rash ( Purple discoloration of the skin on the plantar aspect of the right foot as described above) noted. He is not diaphoretic.    ED Course  Procedures (including critical care time) Labs Review Labs Reviewed  GLUCOSE, CAPILLARY - Abnormal; Notable for the following:    Glucose-Capillary 236 (*)    All other components within normal limits   Imaging Review Dg Foot Complete Right  09/16/2013   CLINICAL DATA:  Basketball injury, pain  EXAM: RIGHT FOOT COMPLETE - 3+ VIEW  COMPARISON:  None.  FINDINGS: Plantar region soft tissue swelling noted on the lateral view. Normal alignment without fracture. Preserved joint spaces. Bipartite sesamoid bone of the right first MTP joint.  IMPRESSION: Plantar soft tissue swelling.  No acute osseous finding   Electronically Signed   By: Ruel Favors M.D.   On: 09/16/2013 00:17    EKG Interpretation   None       MDM   1. Foot pain, right    Patient likely has injury to the plantar fascia, he is a diabetic and there is some discoloration that could be consistent with an early infection as well. There is some swelling and warmth to this area, x-ray to rule out foreign body or occult fracture, Naprosyn, home on antibiotic and followup.  X-rays negative, patient informed to use arch supports, anti-inflammatories, we'll treat with Bactrim prophylactically for possible infection though this is less likely. Patient expresses understanding.   Meds given in ED:  Medications  naproxen (NAPROSYN) tablet 500 mg (500 mg Oral Given 09/15/13 2344)    New Prescriptions   NAPROXEN (NAPROSYN) 500 MG TABLET    Take 1 tablet (500 mg total) by mouth 2 (two) times daily with a meal.   SULFAMETHOXAZOLE-TRIMETHOPRIM (SEPTRA DS) 800-160 MG PER TABLET    Take 1 tablet by mouth every 12  (twelve) hours.      Vida Roller, MD 09/16/13 [phone]

## 2013-09-15 NOTE — ED Notes (Signed)
CBG 236, pt states he has not been checking blood sugar for last week due to getting a new meter soon, states sharp pain to bottom of mid right foot since Wednesday, pain increased, swelling noted to bottom of right foot, no drainage noted, pt does c/o chills occ. But denies fever or N/V

## 2013-09-15 NOTE — ED Notes (Signed)
Patient complaining of sore to bottom of right foot that started last Wednesday. Complains of pain to right foot and difficulty walking on foot. Patient has history of diabetes.

## 2013-09-16 MED ORDER — NAPROXEN 500 MG PO TABS: 500.0000 mg | ORAL_TABLET | Freq: Two times a day (BID) | ORAL | Status: DC

## 2013-09-16 MED ORDER — SULFAMETHOXAZOLE-TRIMETHOPRIM 800-160 MG PO TABS: 1.0000 | ORAL_TABLET | Freq: Two times a day (BID) | ORAL | Status: DC

## 2013-09-16 NOTE — Discharge Instructions (Signed)
Your xrays show no signs of fractures - take the medicine as prescribed - stay off your feet for 2 days - use ice packs and naprosyn. For pain.  Meds given in ED:  Medications  naproxen (NAPROSYN) tablet 500 mg (500 mg Oral Given 09/15/13 2344)    New Prescriptions   NAPROXEN (NAPROSYN) 500 MG TABLET    Take 1 tablet (500 mg total) by mouth 2 (two) times daily with a meal.   SULFAMETHOXAZOLE-TRIMETHOPRIM (SEPTRA DS) 800-160 MG PER TABLET    Take 1 tablet by mouth every 12 (twelve) hours.

## 2013-09-16 NOTE — ED Notes (Signed)
Family at bedside. Patient on stretcher with eyes closed and snoring at this time. Vitals with in normal limits.

## 2013-09-17 ENCOUNTER — Other Ambulatory Visit: Payer: Self-pay

## 2013-09-17 MED ORDER — INSULIN GLARGINE 100 UNIT/ML ~~LOC~~ SOLN: 50.0000 [IU] | Freq: Every day | SUBCUTANEOUS | Status: DC

## 2013-09-20 ENCOUNTER — Emergency Department (HOSPITAL_COMMUNITY)
Admission: EM | Admit: 2013-09-20 | Discharge: 2013-09-20 | Disposition: A | Discharge status: Home / Self Care | Payer: 59 | Attending: Emergency Medicine | Admitting: Emergency Medicine

## 2013-09-20 ENCOUNTER — Encounter (HOSPITAL_COMMUNITY): Payer: Self-pay | Admitting: Emergency Medicine

## 2013-09-20 DIAGNOSIS — Z791 Long term (current) use of non-steroidal anti-inflammatories (NSAID): Secondary | ICD-10-CM | POA: Insufficient documentation

## 2013-09-20 DIAGNOSIS — Z87891 Personal history of nicotine dependence: Secondary | ICD-10-CM | POA: Insufficient documentation

## 2013-09-20 DIAGNOSIS — Z79899 Other long term (current) drug therapy: Secondary | ICD-10-CM | POA: Insufficient documentation

## 2013-09-20 DIAGNOSIS — Z880 Allergy status to penicillin: Secondary | ICD-10-CM | POA: Insufficient documentation

## 2013-09-20 DIAGNOSIS — L02611 Cutaneous abscess of right foot: Secondary | ICD-10-CM

## 2013-09-20 DIAGNOSIS — Z794 Long term (current) use of insulin: Secondary | ICD-10-CM | POA: Insufficient documentation

## 2013-09-20 DIAGNOSIS — I10 Essential (primary) hypertension: Secondary | ICD-10-CM | POA: Insufficient documentation

## 2013-09-20 DIAGNOSIS — G8911 Acute pain due to trauma: Secondary | ICD-10-CM | POA: Insufficient documentation

## 2013-09-20 DIAGNOSIS — L03119 Cellulitis of unspecified part of limb: Principal | ICD-10-CM

## 2013-09-20 DIAGNOSIS — L02619 Cutaneous abscess of unspecified foot: Secondary | ICD-10-CM | POA: Insufficient documentation

## 2013-09-20 DIAGNOSIS — E119 Type 2 diabetes mellitus without complications: Secondary | ICD-10-CM | POA: Insufficient documentation

## 2013-09-20 MED ORDER — HYDROCODONE-ACETAMINOPHEN 5-325 MG PO TABS
1.0000 | ORAL_TABLET | Freq: Once | ORAL | Status: AC
  Administered 2013-09-20: 1 via ORAL
  Filled 2013-09-20: qty 1

## 2013-09-20 MED ORDER — LIDOCAINE HCL (PF) 1 % IJ SOLN
INTRAMUSCULAR | Status: AC
  Filled 2013-09-20: qty 5

## 2013-09-20 MED ORDER — LORAZEPAM 2 MG/ML IJ SOLN
1.0000 mg | Freq: Once | INTRAMUSCULAR | Status: DC
  Filled 2013-09-20: qty 1

## 2013-09-20 MED ORDER — LORAZEPAM 2 MG/ML IJ SOLN
1.0000 mg | Freq: Once | INTRAMUSCULAR | Status: AC
  Administered 2013-09-20: 1 mg via INTRAMUSCULAR

## 2013-09-20 MED ORDER — HYDROCODONE-ACETAMINOPHEN 10-325 MG PO TABS: 1.0000 | ORAL_TABLET | Freq: Three times a day (TID) | ORAL | Status: DC | PRN

## 2013-09-20 NOTE — ED Notes (Signed)
Site dressed with non-adherent pad and wrapped with gauze wrapping. Pt placed sock on foot and post-op shoe placed. Pt tolerated well. Education provided to pt and pt mother concerning infection prevention, hygiene and cleansing. Pt and pt family member verbalized understanding and denied any questions at this time.

## 2013-09-20 NOTE — ED Provider Notes (Signed)
CSN: [id number]     Arrival date & time 09/20/13  1191 History   This chart was scribed for Gerhard Munch, MD, by Yevette Edwards, ED Scribe. This patient was seen in room APA19/[facility]19 and the patient's care was started at 9:35 AM. First MD Initiated Contact with Patient 09/20/13 [phone]     Chief Complaint  Patient presents with  . Foot Pain    The history is provided by the patient and a relative. No language interpreter was used.   HPI Comments: KESHUN BERRETT is a 23 y.o. male, with a h/o DM and HTN, who presents to the Emergency Department complaining of right foot pain which has occurred for approximately ten days ago after playing basketball. The pt was treated in the ED four days ago and diagnosed with a torn plantar; he was prescribed pain medication and an antibiotic. Since treatment, he has experienced increased pain which is not controlled with the medication. He also reports a gradually-increasing suspected abscess to the sole of his right foot which increases the pain of ambulation. He denies fever, emesis, or loss of sensation. The pt states he has experienced a decreased appetite. He has had DM for approximately ten years, and he he reports his glucose levels typically vary. He does not use an insulin pump. Mr. Widmayer is a former smoker.   The pt reports an allergy to penicillin.   Past Medical History  Diagnosis Date  . DM (diabetes mellitus)   . Diabetes mellitus without complication   . Hypertension    Past Surgical History  Procedure Laterality Date  . No past surgeries     Family History  Problem Relation Age of Onset  . Diabetes      family hx (not immediate) of DM    History  Substance Use Topics  . Smoking status: Former Smoker -- .5 years    Types: Cigarettes    Quit date: 09/02/2011  . Smokeless tobacco: Never Used  . Alcohol Use: Yes     Comment: social    Review of Systems  Constitutional:       Per HPI, otherwise negative  HENT:       Per HPI,  otherwise negative  Respiratory:       Per HPI, otherwise negative  Cardiovascular:       Per HPI, otherwise negative  Gastrointestinal: Negative for vomiting.  Endocrine:       Negative aside from HPI  Genitourinary:       Neg aside from HPI   Musculoskeletal:       Per HPI, otherwise negative  Skin: Positive for color change and wound.  Neurological: Negative for syncope.    Allergies  Penicillins  Home Medications   Current Outpatient Rx  Name  Route  Sig  Dispense  Refill  . enalapril (VASOTEC) 10 MG tablet   Oral   Take 1 tablet (10 mg total) by mouth daily.   30 tablet   5   . ibuprofen (ADVIL,MOTRIN) 200 MG tablet   Oral   Take 800 mg by mouth 2 (two) times daily as needed for moderate pain.         Marland Kitchen insulin aspart protamine- aspart (NOVOLOG MIX 70/30) (70-30) 100 UNIT/ML injection   Subcutaneous   Inject 0.1-0.2 mLs (10-20 Units total) into the skin 3 (three) times daily with meals. Sliding scale   10 mL   3   . insulin aspart protamine-insulin aspart (NOVOLOG MIX 70/30) (70-30) 100 UNIT/ML  injection   Subcutaneous   Inject into the skin. 70 units in am and 45 units pm          . insulin glargine (LANTUS) 100 UNIT/ML injection   Subcutaneous   Inject 0.5 mLs (50 Units total) into the skin at bedtime. May titrate up to 100 units qhs   10 mL   3   . metFORMIN (GLUCOPHAGE) 1000 MG tablet   Oral   Take 1 tablet (1,000 mg total) by mouth 2 (two) times daily.   60 tablet   5   . naproxen (NAPROSYN) 500 MG tablet   Oral   Take 1 tablet (500 mg total) by mouth 2 (two) times daily with a meal.   30 tablet   0   . sulfamethoxazole-trimethoprim (SEPTRA DS) 800-160 MG per tablet   Oral   Take 1 tablet by mouth every 12 (twelve) hours.   20 tablet   0    Triage Vitals: BP 143/91  Pulse 108  Temp(Src) 98.2 F (36.8 C) (Oral)  Resp 20  Ht 6\' 3"  (1.905 m)  Wt 310 lb (140.615 kg)  BMI 38.75 kg/m2  SpO2 98%  Physical Exam  Nursing note and  vitals reviewed. Constitutional: He is oriented to person, place, and time. He appears well-developed. No distress.  HENT:  Head: Normocephalic and atraumatic.  Eyes: Conjunctivae and EOM are normal.  Cardiovascular: Normal rate and regular rhythm.   Good PT and DP pulses. Good cap refill.   Pulmonary/Chest: Effort normal. No stridor. No respiratory distress.  Abdominal: He exhibits no distension.  Musculoskeletal: He exhibits tenderness. He exhibits no edema.  On the lateral aspect of plantar foot, an area of fluctuance 2 cm across with minimal surrounding erythema which is tender to palpation.   Neurological: He is alert and oriented to person, place, and time.  Skin: Skin is warm and dry. There is erythema.  Psychiatric: He has a normal mood and affect.    ED Course  Procedures (including critical care time)   COORDINATION OF CARE:  I reviewed his EMR  9:41 am- Discussed treatment plan with patient, which includes pain medication, another antibiotic, a walking boot, and an I and D, and the patient agreed to the plan.   10:57 AM- Performed I and D.   INCISION AND DRAINAGE PROCEDURE NOTE: Patient identification was confirmed and verbal consent was obtained. This procedure was performed by Gerhard Munch, MD at 11:14 AM. Site: Sole of right foot Sterile procedures observed Needle size: 25 gauge Anesthetic used (type and amt): 7.5 mL,1% without Blade size: 11 blade Drainage: purulent pus Complexity: Complex Site anesthetized, incision made over site, wound drained and explored loculations, rinsed with copious amounts of normal saline, wound packed with sterile gauze, covered with dry, sterile dressing.  Pt tolerated procedure well without complications.  Instructions for care discussed verbally and pt provided with additional written instructions for homecare and f/u.  12:06 PM- Rechecked pt. Informed pt of return precautions.   Labs Review Labs Reviewed - No data to  display Imaging Review No results found.  EKG Interpretation   None       MDM  No diagnosis found.  I personally performed the services described in this documentation, which was scribed in my presence. The recorded information has been reviewed and is accurate.   Patient presents with concern of increasing foot pain, and on exam is an abscess in the plantar surface of the distal foot.  The patient isn't  diabetic, he is otherwise well, and incision and drainage was indicated given the degree of pain, and the swelling about the foot.  Procedure was well tolerated, with production of significant amounts of pus.  The patient, his mother and I had a lengthy conversation about wound care, return precautions and appropriate followup, then he was discharged in stable condition.   Gerhard Munchobert Stella Bortle, MD 09/20/13 [phone]

## 2013-09-20 NOTE — Discharge Instructions (Signed)
As discussed, it is important that you monitor your wound carefully.  Please use soaking baths 4 times daily until the wound has resolved.  Please continue to take her antibiotic.  If your symptoms persist, please be sure to follow up with our orthopedist.  Return here for concerning changes in your condition.   Abscess An abscess is an infected area that contains a collection of pus and debris.It can occur in almost any part of the body. An abscess is also known as a furuncle or boil. CAUSES  An abscess occurs when tissue gets infected. This can occur from blockage of oil or sweat glands, infection of hair follicles, or a minor injury to the skin. As the body tries to fight the infection, pus collects in the area and creates pressure under the skin. This pressure causes pain. People with weakened immune systems have difficulty fighting infections and get certain abscesses more often.  SYMPTOMS Usually an abscess develops on the skin and becomes a painful mass that is red, warm, and tender. If the abscess forms under the skin, you may feel a moveable soft area under the skin. Some abscesses break open (rupture) on their own, but most will continue to get worse without care. The infection can spread deeper into the body and eventually into the bloodstream, causing you to feel ill.  DIAGNOSIS  Your caregiver will take your medical history and perform a physical exam. A sample of fluid may also be taken from the abscess to determine what is causing your infection. TREATMENT  Your caregiver may prescribe antibiotic medicines to fight the infection. However, taking antibiotics alone usually does not cure an abscess. Your caregiver may need to make a small cut (incision) in the abscess to drain the pus. In some cases, gauze is packed into the abscess to reduce pain and to continue draining the area. HOME CARE INSTRUCTIONS   Only take over-the-counter or prescription medicines for pain, discomfort, or  fever as directed by your caregiver.  If you were prescribed antibiotics, take them as directed. Finish them even if you start to feel better.  If gauze is used, follow your caregiver's directions for changing the gauze.  To avoid spreading the infection:  Keep your draining abscess covered with a bandage.  Wash your hands well.  Do not share personal care items, towels, or whirlpools with others.  Avoid skin contact with others.  Keep your skin and clothes clean around the abscess.  Keep all follow-up appointments as directed by your caregiver. SEEK MEDICAL CARE IF:   You have increased pain, swelling, redness, fluid drainage, or bleeding.  You have muscle aches, chills, or a general ill feeling.  You have a fever. MAKE SURE YOU:   Understand these instructions.  Will watch your condition.  Will get help right away if you are not doing well or get worse. Document Released: 05/16/2005 Document Revised: 02/05/2012 Document Reviewed: 10/19/2011 [facility] Patient Information 2014 [employer], [place].

## 2013-09-20 NOTE — ED Notes (Signed)
Right foot pain x 2 wks.  Seen here for same last week.  Reports cyst has developed to bottom of right foot.

## 2013-09-21 ENCOUNTER — Emergency Department (HOSPITAL_COMMUNITY)
Admission: EM | Admit: 2013-09-21 | Discharge: 2013-09-21 | Disposition: A | Discharge status: Home / Self Care | Payer: 59 | Attending: Emergency Medicine | Admitting: Emergency Medicine

## 2013-09-21 ENCOUNTER — Encounter (HOSPITAL_COMMUNITY): Payer: Self-pay | Admitting: Emergency Medicine

## 2013-09-21 ENCOUNTER — Ambulatory Visit: Payer: 59 | Admitting: Family Medicine

## 2013-09-21 DIAGNOSIS — E119 Type 2 diabetes mellitus without complications: Secondary | ICD-10-CM | POA: Insufficient documentation

## 2013-09-21 DIAGNOSIS — Z791 Long term (current) use of non-steroidal anti-inflammatories (NSAID): Secondary | ICD-10-CM | POA: Insufficient documentation

## 2013-09-21 DIAGNOSIS — I10 Essential (primary) hypertension: Secondary | ICD-10-CM | POA: Insufficient documentation

## 2013-09-21 DIAGNOSIS — Z79899 Other long term (current) drug therapy: Secondary | ICD-10-CM | POA: Insufficient documentation

## 2013-09-21 DIAGNOSIS — Z880 Allergy status to penicillin: Secondary | ICD-10-CM | POA: Insufficient documentation

## 2013-09-21 DIAGNOSIS — L089 Local infection of the skin and subcutaneous tissue, unspecified: Secondary | ICD-10-CM

## 2013-09-21 DIAGNOSIS — Z794 Long term (current) use of insulin: Secondary | ICD-10-CM | POA: Insufficient documentation

## 2013-09-21 DIAGNOSIS — Z87891 Personal history of nicotine dependence: Secondary | ICD-10-CM | POA: Insufficient documentation

## 2013-09-21 DIAGNOSIS — Z792 Long term (current) use of antibiotics: Secondary | ICD-10-CM | POA: Insufficient documentation

## 2013-09-21 MED ORDER — DOXYCYCLINE HYCLATE 100 MG PO TABS
100.0000 mg | ORAL_TABLET | Freq: Once | ORAL | Status: AC
  Administered 2013-09-21: 100 mg via ORAL
  Filled 2013-09-21: qty 1

## 2013-09-21 MED ORDER — DOXYCYCLINE HYCLATE 100 MG PO CAPS: 100.0000 mg | ORAL_CAPSULE | Freq: Two times a day (BID) | ORAL | Status: DC

## 2013-09-21 NOTE — ED Provider Notes (Signed)
CSN: [id number]     Arrival date & time 09/21/13  1823 History   First MD Initiated Contact with Patient 09/21/13 1932     Chief Complaint  Patient presents with  . Foot Pain   (Consider location/radiation/quality/duration/timing/severity/associated sxs/prior Treatment) Patient is a 23 y.o. male presenting with lower extremity pain. The history is provided by the patient and a parent.  Foot Pain Pertinent negatives include no chest pain, no abdominal pain, no headaches and no shortness of breath.   patient with a complaint of some right foot pain starting back on January 27. Seen yesterday February 1 had I&D of abscess on the ball of the right foot. Marked purulent discharge yesterday patient more purulent discharge today and was concerned that the infection was getting worse. Patient has been on Septra since January 27. Patient does have a history of diabetes. States his blood sugars have been well controlled at home. Patient has been taking the antibiotic. Patient has not been soaking the foot eyes been elevating it some and did go to work yesterday which may not be advisable. Patient primary care Dr. is Dr. Gerda Diss. Patient denies fevers nausea vomiting any worsening of the pain to the foot actually feels better after the I&D.  Past Medical History  Diagnosis Date  . DM (diabetes mellitus)   . Diabetes mellitus without complication   . Hypertension    Past Surgical History  Procedure Laterality Date  . No past surgeries     Family History  Problem Relation Age of Onset  . Diabetes      family hx (not immediate) of DM    History  Substance Use Topics  . Smoking status: Former Smoker -- .5 years    Types: Cigarettes    Quit date: 09/02/2011  . Smokeless tobacco: Never Used  . Alcohol Use: Yes     Comment: social    Review of Systems  Constitutional: Negative for fever.  HENT: Negative for congestion.   Respiratory: Negative for shortness of breath.   Cardiovascular: Negative  for chest pain.  Gastrointestinal: Negative for nausea, vomiting and abdominal pain.  Musculoskeletal: Negative for back pain.  Skin: Positive for wound.  Neurological: Negative for headaches.  Hematological: Does not bruise/bleed easily.  Psychiatric/Behavioral: Negative for confusion.    Allergies  Penicillins  Home Medications   Current Outpatient Rx  Name  Route  Sig  Dispense  Refill  . enalapril (VASOTEC) 10 MG tablet   Oral   Take 1 tablet (10 mg total) by mouth daily.   30 tablet   5   . HYDROcodone-acetaminophen (NORCO) 10-325 MG per tablet   Oral   Take 1 tablet by mouth every 8 (eight) hours as needed.   15 tablet   0   . ibuprofen (ADVIL,MOTRIN) 200 MG tablet   Oral   Take 800 mg by mouth 2 (two) times daily as needed for moderate pain.         Marland Kitchen insulin aspart protamine-insulin aspart (NOVOLOG MIX 70/30) (70-30) 100 UNIT/ML injection   Subcutaneous   Inject into the skin. 70 units in am and 45 units pm          . insulin glargine (LANTUS) 100 UNIT/ML injection   Subcutaneous   Inject 0.5 mLs (50 Units total) into the skin at bedtime. May titrate up to 100 units qhs   10 mL   3   . metFORMIN (GLUCOPHAGE) 1000 MG tablet   Oral   Take 1 tablet (  1,000 mg total) by mouth 2 (two) times daily.   60 tablet   5   . naproxen (NAPROSYN) 500 MG tablet   Oral   Take 1 tablet (500 mg total) by mouth 2 (two) times daily with a meal.   30 tablet   0   . sulfamethoxazole-trimethoprim (SEPTRA DS) 800-160 MG per tablet   Oral   Take 1 tablet by mouth every 12 (twelve) hours.   20 tablet   0   . doxycycline (VIBRAMYCIN) 100 MG capsule   Oral   Take 1 capsule (100 mg total) by mouth 2 (two) times daily.   14 capsule   0    BP 131/82  Pulse 96  Temp(Src) 98.2 F (36.8 C) (Oral)  Resp 24  Ht 6\' 3"  (1.905 m)  Wt 311 lb (141.069 kg)  BMI 38.87 kg/m2  SpO2 98% Physical Exam  Nursing note and vitals reviewed. Constitutional: He is oriented to  person, place, and time. He appears well-developed and well-nourished. No distress.  HENT:  Head: Normocephalic.  Mouth/Throat: Oropharynx is clear and moist.  Eyes: Conjunctivae and EOM are normal.  Neck: Normal range of motion.  Cardiovascular: Normal rate, regular rhythm and normal heart sounds.   No murmur heard. Pulmonary/Chest: Effort normal and breath sounds normal. No respiratory distress.  Abdominal: Soft. Bowel sounds are normal. There is no tenderness.  Musculoskeletal: He exhibits tenderness.  Normal except for ball of right foot at site of I&D draining blood with no purulence currently. Area of tenderness about a 4 cm around the area of I&D no evidence of any red streaking or spreading cellulitis no crepitance. Cap refill to the big toe is 1 second.  Neurological: He is alert and oriented to person, place, and time. No cranial nerve deficit. Coordination normal.  Skin: Skin is warm.    ED Course  Procedures (including critical care time) Labs Review Labs Reviewed - No data to display Imaging Review No results found.  EKG Interpretation   None       MDM   1. Right foot infection    Patient's right foot abscess is currently healing well. Draining a purulent discharge at home no evidence of any significant drainage here no evidence of significant spreading of the infection or cellulitis to the foot. Will switch patient over to doxycycline just in that it may provide a little better coverage and stop the Septra. Will recommend elevating the foot is much possible soaking the foot in warm water twice a day for 20 minutes a day. Work note provided. Patient given a precautions on what to watch for for worsening infection. Patient states his blood sugars at home have been fine. As stated above patient status post I&D of abscess on the ball of the right foot yesterday originally seen for discomfort in that foot on the 27th. No evidence of significant cellulitis or deep space  infection.    Shelda JakesScott W. Shadawn Hanaway, MD 09/21/13 [phone]

## 2013-09-21 NOTE — ED Notes (Signed)
Patient given discharge instruction, verbalized understand. Patient in wheelchair out of the dept

## 2013-09-21 NOTE — Discharge Instructions (Signed)
As we discussed recommend soaking the foot for 20 minutes twice a day in warm water. Elevate the foot is much as possible. Do not recommend going to work for the next 2 days. Work note provided. Return for any newer worse symptoms. Make an appointment to followup with your primary care Dr. in the next few days for recheck. At this point would recommend stopping the Septra and starting doxycycline new prescription provided.

## 2013-09-21 NOTE — ED Notes (Signed)
Patient reports being seen here yesterday morning for abscess to bottom of foot in which was lanced. Denies packing being placed. Per patient went to change dressing and had a large amount of drainage with foul odor. Patient was given Septra DS and hydrocodone.

## 2014-08-15 IMAGING — CR DG FOOT COMPLETE 3+V*R*
3 series · 3 of 3 positions shown · non-contrast
Comparison: None.

CLINICAL DATA: Basketball injury, pain

EXAM:
RIGHT FOOT COMPLETE - 3+ VIEW

[view not recorded (1 of 3)]
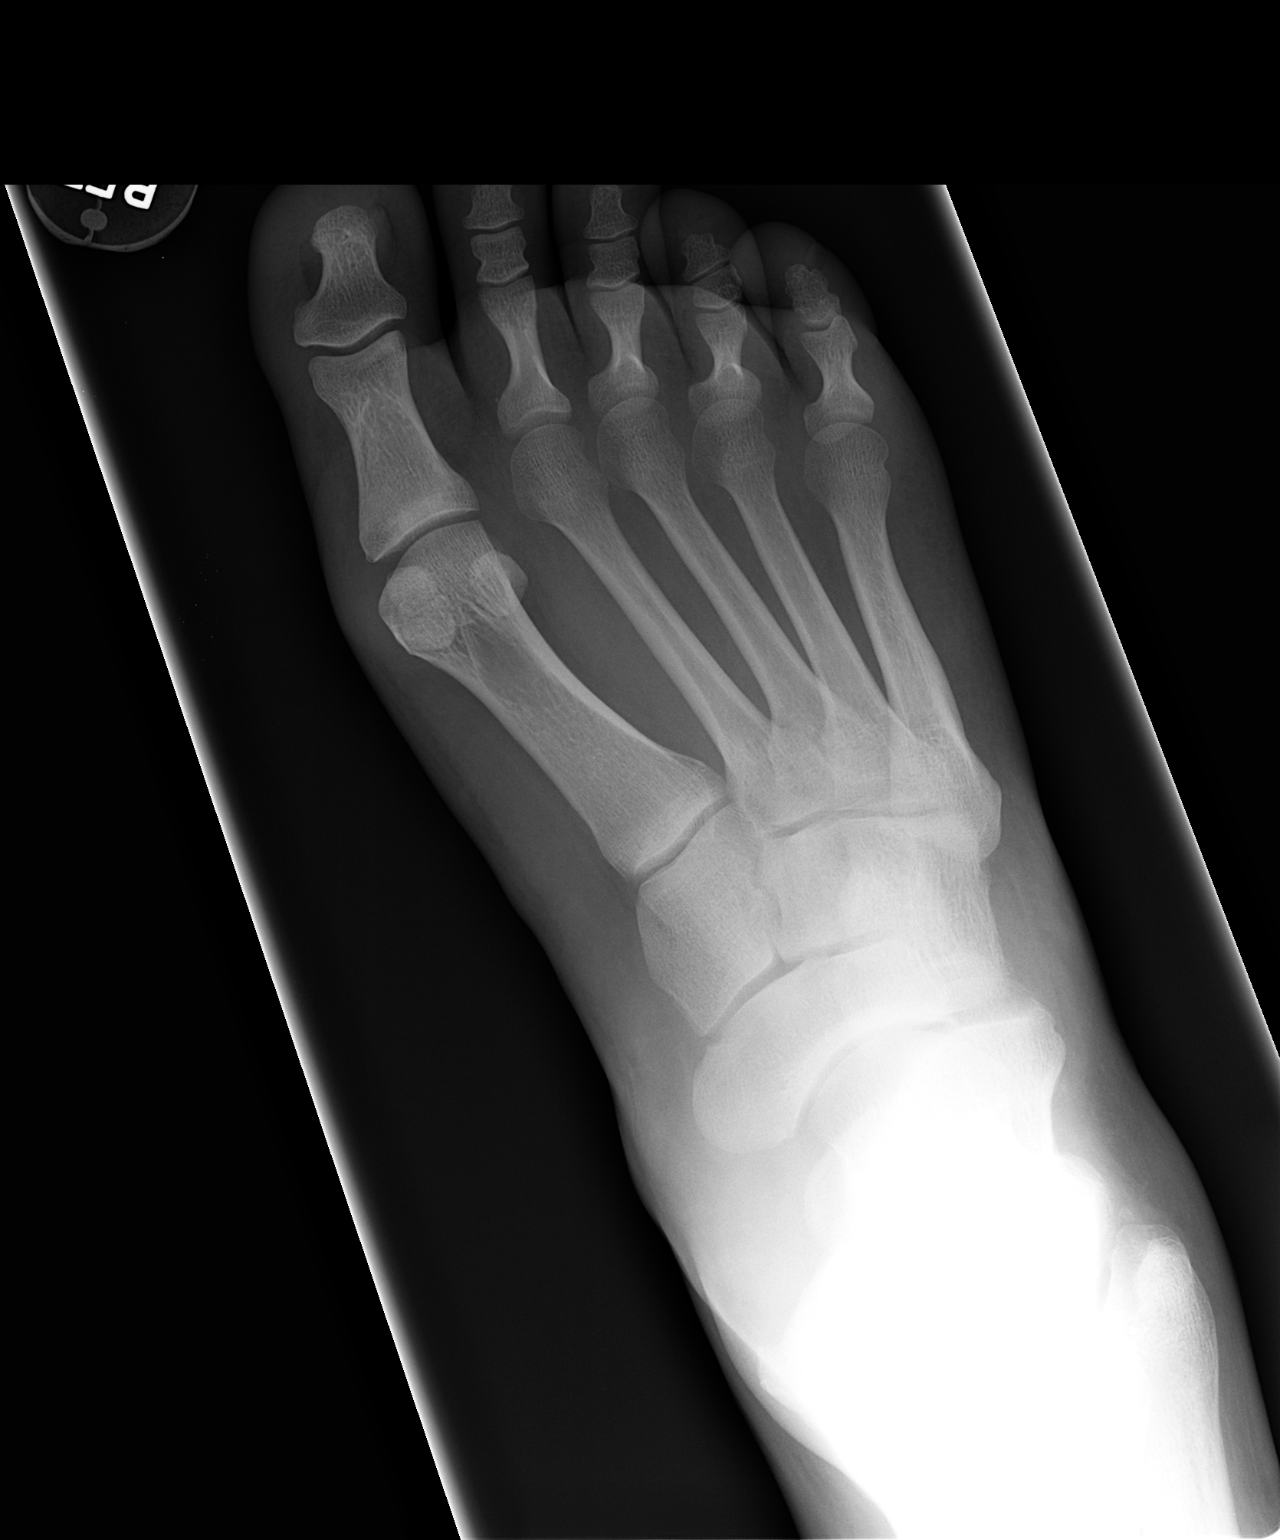

[view not recorded (2 of 3)]
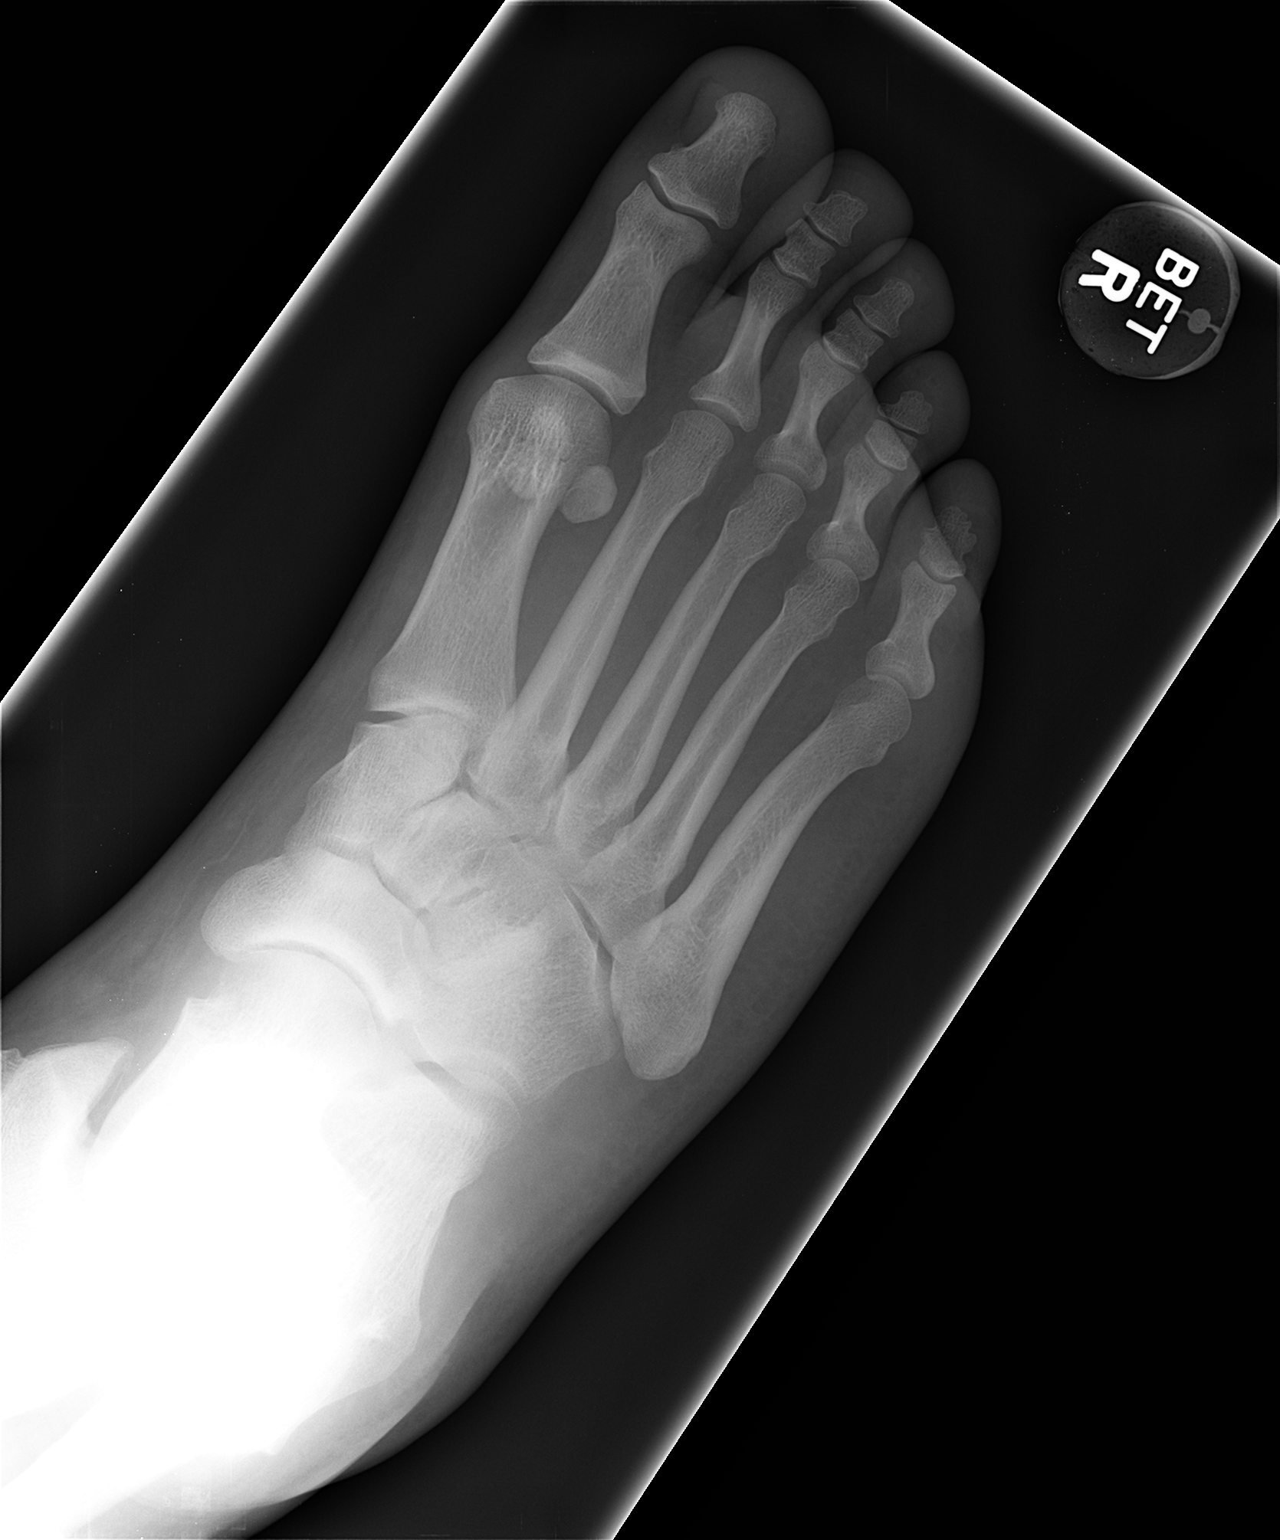

[view not recorded (3 of 3)]
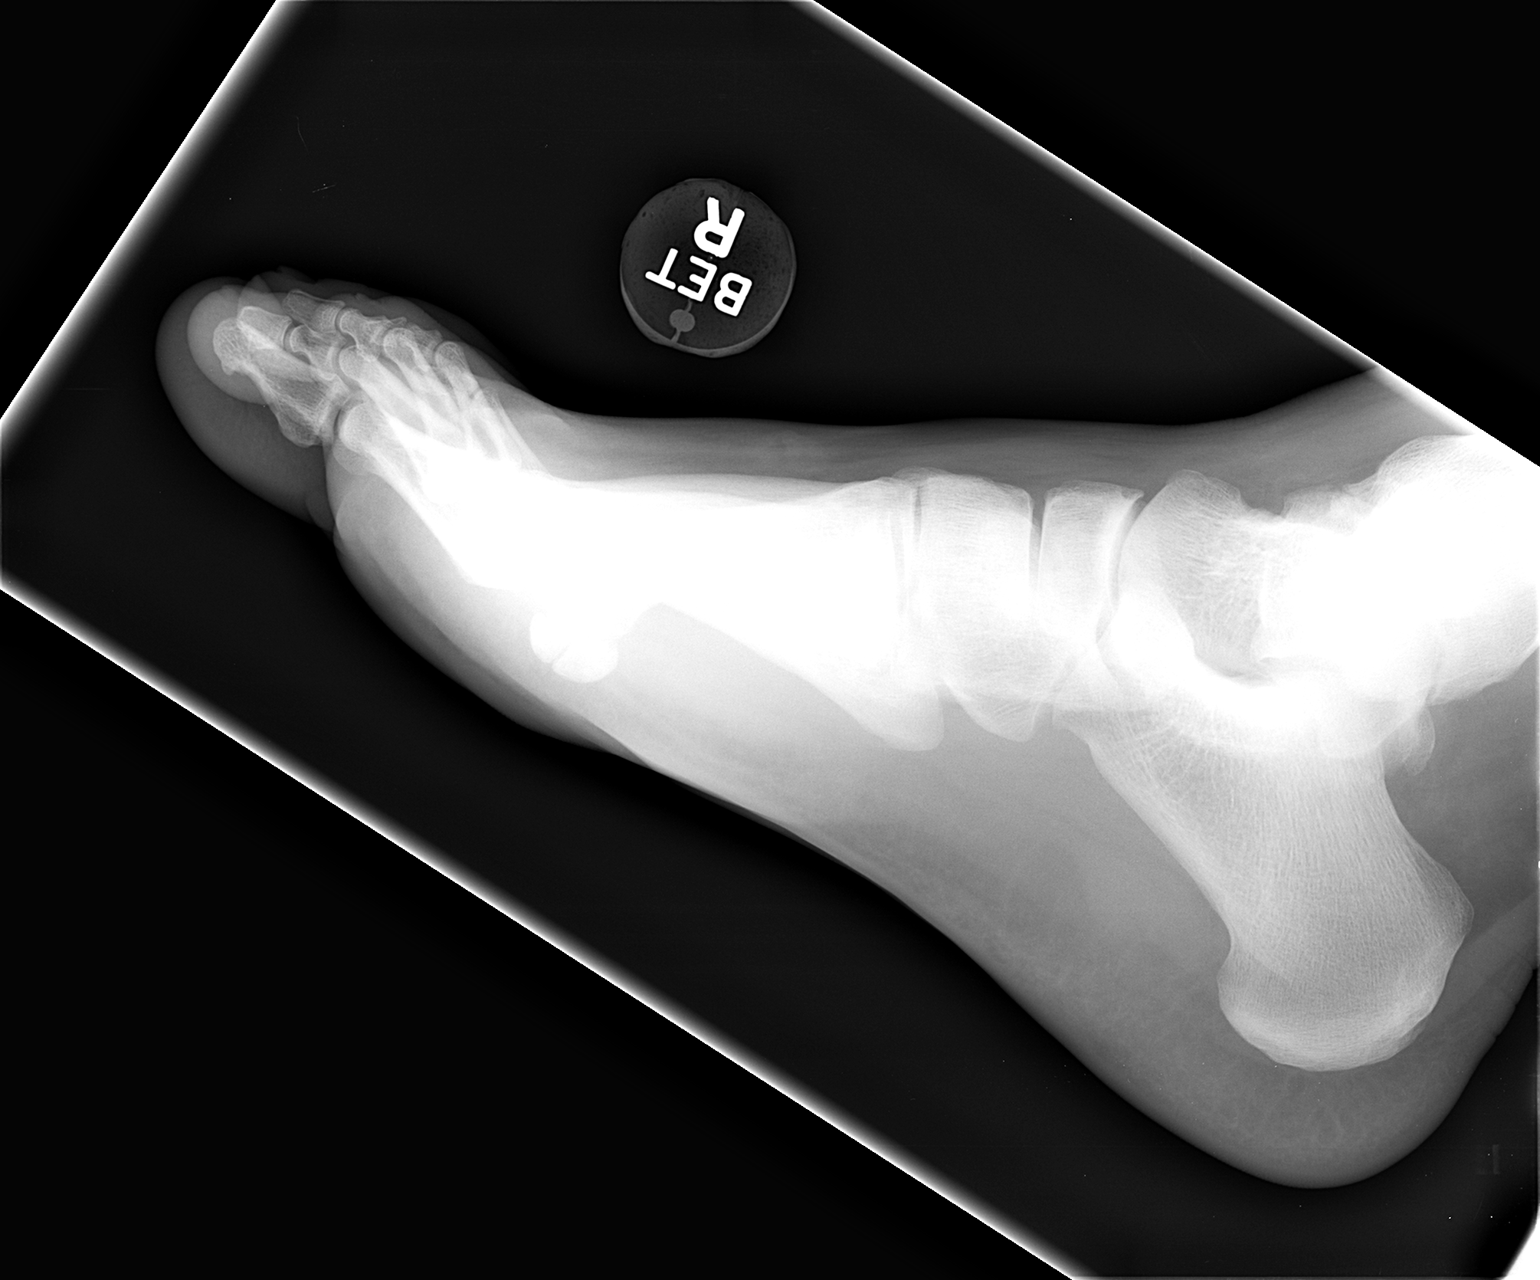

[3 of 3 positions shown; findings below may reference images not displayed]

FINDINGS: Plantar region soft tissue swelling noted on the lateral view.
Normal alignment without fracture. Preserved joint spaces. Bipartite
sesamoid bone of the right first MTP joint.
IMPRESSION: Plantar soft tissue swelling.  No acute osseous finding

## 2015-04-06 LAB — HM DIABETES EYE EXAM

## 2015-04-12 ENCOUNTER — Encounter: Payer: Self-pay | Admitting: *Deleted

## 2016-02-06 LAB — HEMOGLOBIN A1C: Hemoglobin A1C: 10

## 2016-02-14 ENCOUNTER — Encounter: Payer: Self-pay | Admitting: Family Medicine

## 2016-03-15 ENCOUNTER — Encounter: Payer: Self-pay | Admitting: "Endocrinology

## 2016-03-15 ENCOUNTER — Ambulatory Visit (INDEPENDENT_AMBULATORY_CARE_PROVIDER_SITE_OTHER): Payer: Managed Care, Other (non HMO) | Admitting: "Endocrinology

## 2016-03-15 VITALS — BP 143/89 | HR 77 | Ht 75.0 in | Wt 308.0 lb

## 2016-03-15 DIAGNOSIS — E1165 Type 2 diabetes mellitus with hyperglycemia: Secondary | ICD-10-CM

## 2016-03-15 DIAGNOSIS — Z720 Tobacco use: Secondary | ICD-10-CM | POA: Diagnosis not present

## 2016-03-15 DIAGNOSIS — IMO0002 Reserved for concepts with insufficient information to code with codable children: Secondary | ICD-10-CM

## 2016-03-15 DIAGNOSIS — E118 Type 2 diabetes mellitus with unspecified complications: Secondary | ICD-10-CM

## 2016-03-15 DIAGNOSIS — F17200 Nicotine dependence, unspecified, uncomplicated: Secondary | ICD-10-CM

## 2016-03-15 DIAGNOSIS — I10 Essential (primary) hypertension: Secondary | ICD-10-CM | POA: Diagnosis not present

## 2016-03-15 MED ORDER — ACCU-CHEK AVIVA DEVI: 0 refills | Status: DC

## 2016-03-15 MED ORDER — LISINOPRIL-HYDROCHLOROTHIAZIDE 20-25 MG PO TABS
1.0000 | ORAL_TABLET | Freq: Every day | ORAL | 3 refills | Status: DC
Start: 2016-03-15 — End: 2017-10-21

## 2016-03-15 MED ORDER — GLUCOSE BLOOD VI STRP: ORAL_STRIP | 3 refills | Status: DC

## 2016-03-15 MED ORDER — EMPAGLIFLOZIN-METFORMIN HCL ER 12.5-1000 MG PO TB24: 1.0000 | ORAL_TABLET | Freq: Every day | ORAL | 3 refills | Status: DC

## 2016-03-15 MED ORDER — LISINOPRIL-HYDROCHLOROTHIAZIDE 20-25 MG PO TABS: 1.0000 | ORAL_TABLET | Freq: Every day | ORAL | 3 refills | Status: DC

## 2016-03-15 NOTE — Progress Notes (Signed)
Subjective:    Patient ID: Stanley Ramos, male    DOB: [birth year]. Patient is being seen in consultation for management of diabetes requested by  Ignatius Specking, MD  Past Medical History:  Diagnosis Date  . Diabetes mellitus without complication (HCC)   . DM (diabetes mellitus) (HCC)   . Hypertension    Past Surgical History:  Procedure Laterality Date  . NO PAST SURGERIES     Social History   Social History  . Marital status: Single    Spouse name: N/A  . Number of children: N/A  . Years of education: N/A   Social History Main Topics  . Smoking status: Former Smoker    Years: 0.50    Types: Cigarettes    Quit date: 09/02/2011  . Smokeless tobacco: Never Used  . Alcohol use Yes     Comment: social  . Drug use: No  . Sexual activity: Not Asked   Other Topics Concern  . None   Social History Narrative   ** Merged History Encounter **       Single; student; gets regular exercise.    Outpatient Encounter Prescriptions as of 03/15/2016  Medication Sig  . Blood Glucose Monitoring Suppl (ACCU-CHEK AVIVA) device Use as instructed  . Empagliflozin-Metformin HCl ER (SYNJARDY XR) 12.12-998 MG TB24 Take 1 tablet by mouth daily after breakfast.  . glucose blood (ACCU-CHEK AVIVA) test strip Test glucose 4 times a day  . lisinopril-hydrochlorothiazide (PRINZIDE,ZESTORETIC) 20-25 MG tablet Take 1 tablet by mouth daily.  . [DISCONTINUED] Blood Glucose Monitoring Suppl (ACCU-CHEK AVIVA) device Use as instructed  . [DISCONTINUED] doxycycline (VIBRAMYCIN) 100 MG capsule Take 1 capsule (100 mg total) by mouth 2 (two) times daily.  . [DISCONTINUED] Empagliflozin-Metformin HCl ER (SYNJARDY XR) 12.12-998 MG TB24 Take 1 tablet by mouth daily after breakfast.  . [DISCONTINUED] enalapril (VASOTEC) 10 MG tablet Take 1 tablet (10 mg total) by mouth daily.  . [DISCONTINUED] glucose blood (ACCU-CHEK AVIVA) test strip Test glucose 4 times a day  . [DISCONTINUED] HYDROcodone-acetaminophen  (NORCO) 10-325 MG per tablet Take 1 tablet by mouth every 8 (eight) hours as needed.  . [DISCONTINUED] ibuprofen (ADVIL,MOTRIN) 200 MG tablet Take 800 mg by mouth 2 (two) times daily as needed for moderate pain.  . [DISCONTINUED] insulin aspart protamine-insulin aspart (NOVOLOG MIX 70/30) (70-30) 100 UNIT/ML injection Inject into the skin. 70 units in am and 45 units pm   . [DISCONTINUED] insulin glargine (LANTUS) 100 UNIT/ML injection Inject 0.5 mLs (50 Units total) into the skin at bedtime. May titrate up to 100 units qhs  . [DISCONTINUED] lisinopril-hydrochlorothiazide (PRINZIDE,ZESTORETIC) 20-25 MG tablet Take 1 tablet by mouth daily.  . [DISCONTINUED] metFORMIN (GLUCOPHAGE) 1000 MG tablet Take 1 tablet (1,000 mg total) by mouth 2 (two) times daily.  . [DISCONTINUED] naproxen (NAPROSYN) 500 MG tablet Take 1 tablet (500 mg total) by mouth 2 (two) times daily with a meal.  . [DISCONTINUED] sulfamethoxazole-trimethoprim (SEPTRA DS) 800-160 MG per tablet Take 1 tablet by mouth every 12 (twelve) hours.   No facility-administered encounter medications on file as of 03/15/2016.    ALLERGIES: Allergies  Allergen Reactions  . Penicillins Other (See Comments)    unknown   VACCINATION STATUS: Immunization History  Administered Date(s) Administered  . Tdap 06/18/2012    Diabetes  He presents for his initial diabetic visit. He has type 2 diabetes mellitus. Onset time: He was diagnosed at age 25 years. He is high school weight was 320 pounds. He was  likely misdiagnosed as type I and was treated with various cognitions of insulin over the years. His disease course has been worsening. There are no hypoglycemic associated symptoms. Pertinent negatives for hypoglycemia include no headaches, seizures or tremors. Associated symptoms include blurred vision, polydipsia, polyuria and visual change. Pertinent negatives for diabetes include no chest pain. There are no hypoglycemic complications. Symptoms are  worsening. There are no diabetic complications. (Morbidly obese, chronic heavy smoker, sedentary life. ) Risk factors for coronary artery disease include diabetes mellitus, dyslipidemia, family history, male sex, obesity, tobacco exposure, sedentary lifestyle and hypertension. When asked about current treatments, none (Since there was interruption in his insurance, he did not take any therapy for diabetes for 3 weeks.) were reported. His weight is stable. He is following a generally unhealthy diet. When asked about meal planning, he reported none. He has had a previous visit with a dietitian. He participates in exercise intermittently. Home blood sugar record trend: He did not bring any meter nor logs to review today. He admits that he does not monitor blood glucose regularly, in fact he does not have a functioning meter at home. An ACE inhibitor/angiotensin II receptor blocker is not being taken. He does not see a podiatrist.Eye exam is not current.  Hypertension  This is a chronic problem. The current episode started more than 1 year ago. The problem is uncontrolled. Associated symptoms include blurred vision. Pertinent negatives include no chest pain, headaches, palpitations or shortness of breath. Risk factors for coronary artery disease include diabetes mellitus, obesity, male gender, family history, smoking/tobacco exposure and sedentary lifestyle. Past treatments include ACE inhibitors (He is not on any antihypertensive medications at this point.). Compliance problems include diet.        Review of Systems  Constitutional: Negative for chills and fever.  Eyes: Positive for blurred vision.  Respiratory: Negative for cough and shortness of breath.   Cardiovascular: Negative for chest pain and palpitations.       No Shortness of breath  Gastrointestinal: Negative for abdominal pain, diarrhea, nausea and vomiting.  Endocrine: Positive for polydipsia and polyuria.  Genitourinary: Negative for  frequency, hematuria and urgency.  Musculoskeletal: Negative for myalgias.  Skin: Negative for rash.       Has extensive acanthosis nigricans, on his neck, armpits, groins, and joints.  Allergic/Immunologic: Positive for environmental allergies.  Neurological: Negative for tremors, seizures and headaches.  Psychiatric/Behavioral: Negative for hallucinations and suicidal ideas.    Objective:    BP (!) 143/89   Pulse 77   Ht  (1.905 m)   Wt (!) 308 lb (139.7 kg)   BMI 38.50 kg/m   Wt Readings from Last 3 Encounters:  03/15/16 (!) 308 lb (139.7 kg)  09/21/13 (!) 311 lb (141.1 kg)  09/20/13 (!) 310 lb (140.6 kg)    Physical Exam  Constitutional: He is oriented to person, place, and time. He appears well-developed and well-nourished. He is cooperative. No distress.  HENT:  Head: Normocephalic and atraumatic.  Eyes: EOM are normal.  Neck: Normal range of motion. Neck supple. No tracheal deviation present. No thyromegaly present.  Cardiovascular: Normal rate, S1 normal, S2 normal and normal heart sounds.  Exam reveals no gallop.   No murmur heard. Pulses:      Dorsalis pedis pulses are 1+ on the right side, and 1+ on the left side.       Posterior tibial pulses are 1+ on the right side, and 1+ on the left side.  Pulmonary/Chest: Breath sounds  normal. No respiratory distress. He has no wheezes.  Abdominal: Soft. Bowel sounds are normal. He exhibits no distension. There is no tenderness. There is no guarding and no CVA tenderness.  Musculoskeletal: He exhibits no edema.       Right shoulder: He exhibits no swelling and no deformity.  Neurological: He is alert and oriented to person, place, and time. He has normal strength and normal reflexes. No cranial nerve deficit or sensory deficit. Gait normal.  Skin: Skin is warm and dry. No rash noted. No cyanosis. Nails show no clubbing.  Psychiatric: He has a normal mood and affect. His speech is normal and behavior is normal. Judgment and  thought content normal. Cognition and memory are normal.     CMP ( most recent) CMP     Component Value Date/Time   NA 139 06/18/2012 0546   K 3.8 06/18/2012 0546   CL 100 06/18/2012 0546   CO2 24 06/18/2012 0543   GLUCOSE 316 (H) 06/18/2012 0546   BUN 14 06/18/2012 0546   CREATININE 0.94 06/18/2012 0949   CALCIUM 9.6 06/18/2012 0543   PROT 7.8 06/18/2012 0543   ALBUMIN 3.8 06/18/2012 0543   AST 96 (H) 06/18/2012 0543   ALT 85 (H) 06/18/2012 0543   ALKPHOS 95 06/18/2012 0543   BILITOT 0.5 06/18/2012 0543   GFRNONAA >90 06/18/2012 0949   GFRAA >90 06/18/2012 0949     Diabetic Labs (most recent): Lab Results  Component Value Date   HGBA1C 10 02/06/2016   HGBA1C 8.3 09/01/2013   HGBA1C (H) 03/22/2009    13.1 (NOTE) The ADA recommends the following therapeutic goal for glycemic control related to Hgb A1c measurement: Goal of therapy: <6.5 Hgb A1c  Reference: American Diabetes Association: Clinical Practice Recommendations 2010, Diabetes Care, 2010, 33: (Suppl  1).       Assessment & Plan:   1. Uncontrolled type 2 diabetes mellitus with complication, without long-term current use of insulin (HCC)  - Patient has currently uncontrolled symptomatic type 2 DM since  25 years of age,  with most recent A1c of 10 %. Recent labs reviewed.   His diabetes is complicated by morbid obesity and patient remains at a high risk for more acute and chronic complications of diabetes which include CAD, CVA, CKD, retinopathy, and neuropathy. These are all discussed in detail with the patient.  - I have counseled the patient on diet management and weight loss, by adopting a carbohydrate restricted/protein rich diet.  - Suggestion is made for patient to avoid simple carbohydrates   from their diet including Cakes , Desserts, Ice Cream,  Soda (  diet and regular) , Sweet Tea , Candies,  Chips, Cookies, Artificial Sweeteners,   and "Sugar-free" Products . This will help patient to have stable  blood glucose profile and potentially avoid unintended weight gain.  - I encouraged the patient to switch to  unprocessed or minimally processed complex starch and increased protein intake (animal or plant source), fruits, and vegetables.  - Patient is advised to stick to a routine mealtimes to eat 3 meals  a day and avoid unnecessary snacks ( to snack only to correct hypoglycemia).  - The patient will be scheduled with Norm Salt, RDN, CDE for individualized DM education.  - I have approached patient with the following individualized plan to manage diabetes and patient agrees:   - I  will proceed to initiate strict monitoring of glucose  AC and HS. - She is likely that he will need  insulin treatment at least for brief period of time depending on his blood glucose profile and commitment. -Patient is encouraged to call clinic for blood glucose levels less than 70 or above 300 mg /dl. - I will start  Synjardy , combination of metformin and jardiance, 12.5/1000mg  extended release once daily after breakfast. This is therapeutically suitable for patient.. - Patient will be considered for incretin therapy as appropriate next visit. - Patient specific target  A1c;  LDL, HDL, Triglycerides, and  Waist Circumference were discussed in detail.  2) BP/HTN:  Uncontrolled and not on any medications. I have initiated lisinopril/hydrochlorothiazide 20/25 mg by mouth daily. 3) Lipids/HPL:  Lipid levels unknown, he is not on statins. I will obtain fasting lipid panel on subsequent visits. 4)  Weight/Diet: CDE Consult will be initiated , exercise, and detailed carbohydrates information provided.  5) Chronic Care/Health Maintenance:  -Patient is started on ACEI medications and encouraged to continue to follow up with Ophthalmology, Podiatrist at least yearly or according to recommendations, and advised to  Quit smoking. I have recommended yearly flu vaccine and pneumonia vaccination at least every 5 years;  moderate intensity exercise for up to 150 minutes weekly; and  sleep for at least 7 hours a day.  - 60 minutes of time was spent on the care of this patient , 50% of which was applied for counseling on diabetes complications and their preventions.  - Patient to bring meter and  blood glucose logs during their next visit.   - I advised patient to maintain close follow up with Ignatius Specking, MD for primary care needs.  Follow up plan: - Return in about 1 week (around 03/22/2016) for follow up with meter and logs- no labs.  Marquis Lunch, MD Phone: [phone]  Fax: [phone]   03/15/2016, 1:24 PM

## 2016-03-22 ENCOUNTER — Ambulatory Visit (INDEPENDENT_AMBULATORY_CARE_PROVIDER_SITE_OTHER): Payer: Managed Care, Other (non HMO) | Admitting: "Endocrinology

## 2016-03-22 ENCOUNTER — Encounter: Payer: Self-pay | Admitting: "Endocrinology

## 2016-03-22 VITALS — BP 134/84 | HR 87 | Wt 308.1 lb

## 2016-03-22 DIAGNOSIS — E118 Type 2 diabetes mellitus with unspecified complications: Secondary | ICD-10-CM

## 2016-03-22 DIAGNOSIS — I10 Essential (primary) hypertension: Secondary | ICD-10-CM

## 2016-03-22 DIAGNOSIS — E1165 Type 2 diabetes mellitus with hyperglycemia: Secondary | ICD-10-CM

## 2016-03-22 DIAGNOSIS — IMO0002 Reserved for concepts with insufficient information to code with codable children: Secondary | ICD-10-CM

## 2016-03-22 DIAGNOSIS — Z720 Tobacco use: Secondary | ICD-10-CM

## 2016-03-22 DIAGNOSIS — F17200 Nicotine dependence, unspecified, uncomplicated: Secondary | ICD-10-CM

## 2016-03-22 MED ORDER — METFORMIN HCL 500 MG PO TABS: 500.0000 mg | ORAL_TABLET | Freq: Two times a day (BID) | ORAL | 2 refills | Status: DC

## 2016-03-22 NOTE — Patient Instructions (Signed)

## 2016-03-22 NOTE — Progress Notes (Signed)
Subjective:    Patient ID: Stanley Ramos, male    DOB: [birth year]. Patient is being seen in f/u for management of diabetes requested by  Ignatius Specking, MD  Past Medical History:  Diagnosis Date  . Diabetes mellitus without complication (HCC)   . DM (diabetes mellitus) (HCC)   . Hypertension    Past Surgical History:  Procedure Laterality Date  . NO PAST SURGERIES     Social History   Social History  . Marital status: Single    Spouse name: N/A  . Number of children: N/A  . Years of education: N/A   Social History Main Topics  . Smoking status: Former Smoker    Years: 0.50    Types: Cigarettes    Quit date: 09/02/2011  . Smokeless tobacco: Never Used  . Alcohol use Yes     Comment: social  . Drug use: No  . Sexual activity: Not Asked   Other Topics Concern  . None   Social History Narrative   ** Merged History Encounter **       Single; student; gets regular exercise.    Outpatient Encounter Prescriptions as of 03/22/2016  Medication Sig  . Blood Glucose Monitoring Suppl (ACCU-CHEK AVIVA) device Use as instructed  . glucose blood (ACCU-CHEK AVIVA) test strip Test glucose 4 times a day  . lisinopril-hydrochlorothiazide (PRINZIDE,ZESTORETIC) 20-25 MG tablet Take 1 tablet by mouth daily.  . Empagliflozin-Metformin HCl ER (SYNJARDY XR) 12.12-998 MG TB24 Take 1 tablet by mouth daily after breakfast. (Patient not taking: Reported on 03/22/2016)  . metFORMIN (GLUCOPHAGE) 500 MG tablet Take 1 tablet (500 mg total) by mouth 2 (two) times daily with a meal.   No facility-administered encounter medications on file as of 03/22/2016.    ALLERGIES: Allergies  Allergen Reactions  . Penicillins Other (See Comments)    unknown   VACCINATION STATUS: Immunization History  Administered Date(s) Administered  . Tdap 06/18/2012    Diabetes  He presents for his follow-up diabetic visit. He has type 2 diabetes mellitus. Onset time: He was diagnosed at age 25 years. He is high  school weight was 320 pounds. He was likely misdiagnosed as type I and was treated with various cognitions of insulin over the years. His disease course has been stable. There are no hypoglycemic associated symptoms. Pertinent negatives for hypoglycemia include no headaches, seizures or tremors. Associated symptoms include blurred vision, polydipsia, polyuria and visual change. Pertinent negatives for diabetes include no chest pain. There are no hypoglycemic complications. Symptoms are stable. There are no diabetic complications. (Morbidly obese, chronic heavy smoker, sedentary life. ) Risk factors for coronary artery disease include diabetes mellitus, dyslipidemia, family history, male sex, obesity, tobacco exposure, sedentary lifestyle and hypertension. When asked about current treatments, none (Since there was interruption in his insurance, he did not take any therapy for diabetes for 3 weeks.) were reported. His weight is stable. He is following a generally unhealthy diet. When asked about meal planning, he reported none. He has had a previous visit with a dietitian. He participates in exercise intermittently. His breakfast blood glucose range is generally >200 mg/dl. His lunch blood glucose range is generally >200 mg/dl. His dinner blood glucose range is generally >200 mg/dl. His overall blood glucose range is >200 mg/dl. An ACE inhibitor/angiotensin II receptor blocker is not being taken. He does not see a podiatrist.Eye exam is not current.  Hypertension  This is a chronic problem. The current episode started more than 1 year  ago. The problem is uncontrolled. Associated symptoms include blurred vision. Pertinent negatives include no chest pain, headaches, palpitations or shortness of breath. Risk factors for coronary artery disease include diabetes mellitus, obesity, male gender, family history, smoking/tobacco exposure and sedentary lifestyle. Past treatments include ACE inhibitors (He is not on any  antihypertensive medications at this point.). Compliance problems include diet.        Review of Systems  Constitutional: Negative for chills and fever.  Eyes: Positive for blurred vision.  Respiratory: Negative for cough and shortness of breath.   Cardiovascular: Negative for chest pain and palpitations.       No Shortness of breath  Gastrointestinal: Negative for abdominal pain, diarrhea, nausea and vomiting.  Endocrine: Positive for polydipsia and polyuria.  Genitourinary: Negative for frequency, hematuria and urgency.  Musculoskeletal: Negative for myalgias.  Skin: Negative for rash.       Has extensive acanthosis nigricans, on his neck, armpits, groins, and joints.  Allergic/Immunologic: Positive for environmental allergies.  Neurological: Negative for tremors, seizures and headaches.  Psychiatric/Behavioral: Negative for hallucinations and suicidal ideas.    Objective:    BP 134/84   Pulse 87   Wt (!) 308 lb 2 oz (139.8 kg)   BMI 38.51 kg/m   Wt Readings from Last 3 Encounters:  03/22/16 (!) 308 lb 2 oz (139.8 kg)  03/15/16 (!) 308 lb (139.7 kg)  09/21/13 (!) 311 lb (141.1 kg)    Physical Exam  Constitutional: He is oriented to person, place, and time. He appears well-developed and well-nourished. He is cooperative. No distress.  HENT:  Head: Normocephalic and atraumatic.  Eyes: EOM are normal.  Neck: Normal range of motion. Neck supple. No tracheal deviation present. No thyromegaly present.  Cardiovascular: Normal rate, S1 normal, S2 normal and normal heart sounds.  Exam reveals no gallop.   No murmur heard. Pulses:      Dorsalis pedis pulses are 1+ on the right side, and 1+ on the left side.       Posterior tibial pulses are 1+ on the right side, and 1+ on the left side.  Pulmonary/Chest: Breath sounds normal. No respiratory distress. He has no wheezes.  Abdominal: Soft. Bowel sounds are normal. He exhibits no distension. There is no tenderness. There is no  guarding and no CVA tenderness.  Musculoskeletal: He exhibits no edema.       Right shoulder: He exhibits no swelling and no deformity.  Neurological: He is alert and oriented to person, place, and time. He has normal strength and normal reflexes. No cranial nerve deficit or sensory deficit. Gait normal.  Skin: Skin is warm and dry. No rash noted. No cyanosis. Nails show no clubbing.  Psychiatric: He has a normal mood and affect. His speech is normal and behavior is normal. Judgment and thought content normal. Cognition and memory are normal.     CMP ( most recent) CMP     Component Value Date/Time   NA 139 06/18/2012 0546   K 3.8 06/18/2012 0546   CL 100 06/18/2012 0546   CO2 24 06/18/2012 0543   GLUCOSE 316 (H) 06/18/2012 0546   BUN 14 06/18/2012 0546   CREATININE 0.94 06/18/2012 0949   CALCIUM 9.6 06/18/2012 0543   PROT 7.8 06/18/2012 0543   ALBUMIN 3.8 06/18/2012 0543   AST 96 (H) 06/18/2012 0543   ALT 85 (H) 06/18/2012 0543   ALKPHOS 95 06/18/2012 0543   BILITOT 0.5 06/18/2012 0543   GFRNONAA >90 06/18/2012 1610  GFRAA >90 06/18/2012 0949     Diabetic Labs (most recent): Lab Results  Component Value Date   HGBA1C 10 02/06/2016   HGBA1C 8.3 09/01/2013   HGBA1C (H) 03/22/2009    13.1 (NOTE) The ADA recommends the following therapeutic goal for glycemic control related to Hgb A1c measurement: Goal of therapy: <6.5 Hgb A1c  Reference: American Diabetes Association: Clinical Practice Recommendations 2010, Diabetes Care, 2010, 33: (Suppl  1).       Assessment & Plan:   1. Uncontrolled type 2 diabetes mellitus with complication, without long-term current use of insulin (HCC)  - Patient has currently uncontrolled symptomatic type 2 DM since  25 years of age,  with most recent A1c of 10 %. Recent labs reviewed.   His diabetes is complicated by morbid obesity and patient remains at a high risk for more acute and chronic complications of diabetes which include CAD, CVA,  CKD, retinopathy, and neuropathy. These are all discussed in detail with the patient.  - I have counseled the patient on diet management and weight loss, by adopting a carbohydrate restricted/protein rich diet.  - Suggestion is made for patient to avoid simple carbohydrates   from their diet including Cakes , Desserts, Ice Cream,  Soda (  diet and regular) , Sweet Tea , Candies,  Chips, Cookies, Artificial Sweeteners,   and "Sugar-free" Products . This will help patient to have stable blood glucose profile and potentially avoid unintended weight gain.  - I encouraged the patient to switch to  unprocessed or minimally processed complex starch and increased protein intake (animal or plant source), fruits, and vegetables.  - Patient is advised to stick to a routine mealtimes to eat 3 meals  a day and avoid unnecessary snacks ( to snack only to correct hypoglycemia).  - The patient will be scheduled with Norm Salt, RDN, CDE for individualized DM education.  - I have approached patient with the following individualized plan to manage diabetes and patient agrees:   -  His meter and logs show persistent hyperglycemia above 200 mg/dL. - Because of his insurance back and forth, he did not get the  [place]  yet. - In the meantime, I will initiate plain metformin 500 mg by mouth twice a day. - If he is approved for Synjardy , which is a combination of metformin and Jardiance, 12.5/1000mg  extended release, he will start one pill  once daily after breakfast, and stopped the plain metformin at that point.  - Patient will be considered for incretin therapy as appropriate next visit. - Patient specific target  A1c;  LDL, HDL, Triglycerides, and  Waist Circumference were discussed in detail.  2) BP/HTN:  controlled . I have initiated lisinopril/hydrochlorothiazide 20/25 mg by mouth daily. He is advised to continue. 3) Lipids/HPL:  Lipid levels unknown, he is not on statins. I will obtain fasting lipid  panel on subsequent visits. 4)  Weight/Diet: CDE Consult will be initiated , exercise, and detailed carbohydrates information provided.  5) Chronic Care/Health Maintenance:  -Patient is started on ACEI medications and encouraged to continue to follow up with Ophthalmology, Podiatrist at least yearly or according to recommendations, and advised to  Quit smoking. I have recommended yearly flu vaccine and pneumonia vaccination at least every 5 years; moderate intensity exercise for up to 150 minutes weekly; and  sleep for at least 7 hours a day.  - 25 minutes of time was spent on the care of this patient , 50% of which was applied for  counseling on diabetes complications and their preventions.  - Patient to bring meter and  blood glucose logs during their next visit.   - I advised patient to maintain close follow up with Ignatius Specking, MD for primary care needs.  Follow up plan: - Return in about 10 weeks (around 05/31/2016) for follow up with pre-visit labs.  Marquis Lunch, MD Phone: [phone]  Fax: [phone]   03/22/2016, 3:53 PM

## 2016-03-26 ENCOUNTER — Encounter: Payer: Self-pay | Admitting: "Endocrinology

## 2016-03-26 ENCOUNTER — Other Ambulatory Visit: Payer: Self-pay | Admitting: "Endocrinology

## 2016-03-28 ENCOUNTER — Encounter: Payer: 59 | Attending: "Endocrinology | Admitting: Nutrition

## 2016-03-28 VITALS — Ht 74.5 in | Wt 309.6 lb

## 2016-03-28 DIAGNOSIS — IMO0002 Reserved for concepts with insufficient information to code with codable children: Secondary | ICD-10-CM

## 2016-03-28 DIAGNOSIS — E119 Type 2 diabetes mellitus without complications: Secondary | ICD-10-CM | POA: Insufficient documentation

## 2016-03-28 DIAGNOSIS — E118 Type 2 diabetes mellitus with unspecified complications: Secondary | ICD-10-CM

## 2016-03-28 DIAGNOSIS — E669 Obesity, unspecified: Secondary | ICD-10-CM | POA: Insufficient documentation

## 2016-03-28 DIAGNOSIS — Z713 Dietary counseling and surveillance: Secondary | ICD-10-CM | POA: Insufficient documentation

## 2016-03-28 DIAGNOSIS — Z6839 Body mass index (BMI) 39.0-39.9, adult: Secondary | ICD-10-CM | POA: Insufficient documentation

## 2016-03-28 DIAGNOSIS — E1165 Type 2 diabetes mellitus with hyperglycemia: Secondary | ICD-10-CM

## 2016-03-28 NOTE — Progress Notes (Signed)
  Medical Nutrition Therapy:  Appt start time: 1600 end time:  1730.   Assessment:  Primary concerns today: Diabetes and obesity. LIves with mom and sister. Not testing anymore due to blood sugars better. Synjardy once a day. Eats three meals per day. Not testing blood sugars A1C 10%.  Trying to eat much better meals and more aware of portions.  Diet is inconsistent in CHO, and low carb vegetables.  Lab Results  Component Value Date   HGBA1C 10 02/06/2016   Wt Readings from Last 3 Encounters:  03/28/16 (!) 309 lb 9.6 oz (140.4 kg)  03/22/16 (!) 308 lb 2 oz (139.8 kg)  03/15/16 (!) 308 lb (139.7 kg)   Ht Readings from Last 3 Encounters:  03/28/16 6' 2.5" (1.892 m)  03/15/16 6\' 3"  (1.905 m)  09/21/13 6\' 3"  (1.905 m)   Body mass index is 39.22 kg/m.   Preferred Learning Style:  No preference indicated   Learning Readiness:  Ready  Change in progress   MEDICATIONS: see list   DIETARY INTAKE:.    24-hr recall:  B ( AM):2 boiled eggs, 1 pack of nabs water  Snk ( AM): none  L ( PM): 2 grilled chicken breasts, bottles water Snk ( PM): none D ( PM):  Potatoes, chicken, corn, water Snk ( PM): none Beverages:water  Usual physical activity: Basketball 3-4 times per week.  Estimated energy needs: 2000 calories 225 g carbohydrates 150 g protein 56 g fat  Progress Towards Goal(s):  In progress.   Nutritional Diagnosis:  NB-1.1 Food and nutrition-related knowledge deficit As related to Diabets.  As evidenced by A1C 10%.    Intervention:  Nutrition and Diabetes education provided on My Plate, CHO counting, meal planning, portion sizes, timing of meals, avoiding snacks between meals unless having a low blood sugar, target ranges for A1C and blood sugars, signs/symptoms and treatment of hyper/hypoglycemia, monitoring blood sugars, taking medications as prescribed, benefits of exercising 30 minutes per day and prevention of complications of DM.   Goals 1 Follow My  Plate 2. Eat three balanced meals 3. Eat 4-5 carb choices per meal. 4 Increase fresh fruits and vegetables. 5. Exercise 30 minutes three times per week. 6. Lose 1-2 lbs per week. 7. Drink only water 8. Avoid snacks between meals. 9. Get A1C to 7%  Teaching Method Utilized:   Visual Auditory Hands on  Handouts given during visit include:  The Plate Method  Meal Plan  Diabetes Instructions.   Barriers to learning/adherence to lifestyle change: None  Demonstrated degree of understanding via:  Teach Back   Monitoring/Evaluation:  Dietary intake, exercise, meal planning, SBG, and body weight in 1 month(s).

## 2016-04-13 NOTE — Patient Instructions (Signed)
Goals    None    1. Follow My Plate 2. Eat three balanced meals 3. Eat 4-5 carb choices per meal. 4 Increase fresh fruits and vegetables. 5. Exercise 30 minutes three times per week. 6. Lose 1-2 lbs per week. 7. Drink only water 8. Avoid snacks between meals. 9. Get A1C to 7%

## 2016-06-06 ENCOUNTER — Ambulatory Visit: Payer: Managed Care, Other (non HMO) | Admitting: "Endocrinology

## 2016-06-06 ENCOUNTER — Ambulatory Visit: Payer: Self-pay | Admitting: Nutrition

## 2017-09-17 LAB — HEMOGLOBIN A1C: Hemoglobin A1C: 11.1

## 2017-10-02 ENCOUNTER — Other Ambulatory Visit: Payer: Self-pay

## 2017-10-02 ENCOUNTER — Other Ambulatory Visit: Payer: Self-pay | Admitting: "Endocrinology

## 2017-10-02 ENCOUNTER — Ambulatory Visit: Payer: BLUE CROSS/BLUE SHIELD | Admitting: "Endocrinology

## 2017-10-02 DIAGNOSIS — E559 Vitamin D deficiency, unspecified: Secondary | ICD-10-CM

## 2017-10-02 DIAGNOSIS — E1165 Type 2 diabetes mellitus with hyperglycemia: Secondary | ICD-10-CM

## 2017-10-18 ENCOUNTER — Encounter: Payer: Self-pay | Admitting: "Endocrinology

## 2017-10-18 ENCOUNTER — Ambulatory Visit: Payer: BLUE CROSS/BLUE SHIELD | Admitting: "Endocrinology

## 2017-10-21 ENCOUNTER — Encounter: Payer: Self-pay | Admitting: "Endocrinology

## 2017-10-21 ENCOUNTER — Ambulatory Visit (INDEPENDENT_AMBULATORY_CARE_PROVIDER_SITE_OTHER): Payer: BLUE CROSS/BLUE SHIELD | Admitting: "Endocrinology

## 2017-10-21 VITALS — BP 133/89 | HR 77 | Ht 75.0 in | Wt 297.0 lb

## 2017-10-21 DIAGNOSIS — I10 Essential (primary) hypertension: Secondary | ICD-10-CM | POA: Diagnosis not present

## 2017-10-21 DIAGNOSIS — E118 Type 2 diabetes mellitus with unspecified complications: Secondary | ICD-10-CM

## 2017-10-21 DIAGNOSIS — E1165 Type 2 diabetes mellitus with hyperglycemia: Secondary | ICD-10-CM | POA: Diagnosis not present

## 2017-10-21 DIAGNOSIS — IMO0002 Reserved for concepts with insufficient information to code with codable children: Secondary | ICD-10-CM

## 2017-10-21 MED ORDER — ACCU-CHEK GUIDE W/DEVICE KIT: 1.0000 | PACK | 0 refills | Status: AC

## 2017-10-21 MED ORDER — EMPAGLIFLOZIN-METFORMIN HCL ER 12.5-1000 MG PO TB24: 1.0000 | ORAL_TABLET | Freq: Every day | ORAL | 3 refills | Status: DC

## 2017-10-21 MED ORDER — GLUCOSE BLOOD VI STRP: ORAL_STRIP | 2 refills | Status: DC

## 2017-10-21 NOTE — Progress Notes (Signed)
Subjective:    Patient ID: Stanley Ramos, male    DOB: 1991-08-06. Patient is being seen in f/u for management of diabetes requested by  Glenda Chroman, MD  Past Medical History:  Diagnosis Date  . Diabetes mellitus without complication ([place])   . DM (diabetes mellitus) ([place])   . Hypertension    Past Surgical History:  Procedure Laterality Date  . NO PAST SURGERIES     Social History   Socioeconomic History  . Marital status: Single    Spouse name: None  . Number of children: None  . Years of education: None  . Highest education level: None  Social Needs  . Financial resource strain: None  . Food insecurity - worry: None  . Food insecurity - inability: None  . Transportation needs - medical: None  . Transportation needs - non-medical: None  Occupational History  . None  Tobacco Use  . Smoking status: Former Smoker    Years: 0.50    Types: Cigarettes    Last attempt to quit: 09/02/2011    Years since quitting: 6.1  . Smokeless tobacco: Never Used  Substance and Sexual Activity  . Alcohol use: Yes    Comment: social  . Drug use: No  . Sexual activity: None  Other Topics Concern  . None  Social History Narrative   ** Merged History Encounter **       Single; student; gets regular exercise.    Outpatient Encounter Medications as of 10/21/2017  Medication Sig  . Blood Glucose Monitoring Suppl (ACCU-CHEK GUIDE) w/Device KIT 1 Piece by Does not apply route as directed.  . Empagliflozin-metFORMIN HCl ER (SYNJARDY XR) 12.12-998 MG TB24 Take 1 tablet by mouth daily after breakfast.  . glucose blood (ACCU-CHEK GUIDE) test strip Use as instructed  . [DISCONTINUED] Blood Glucose Monitoring Suppl (ACCU-CHEK AVIVA) device Use as instructed  . [DISCONTINUED] Empagliflozin-Metformin HCl ER (SYNJARDY XR) 12.12-998 MG TB24 Take 1 tablet by mouth daily after breakfast. (Patient not taking: Reported on 10/21/2017)  . [DISCONTINUED] glucose blood (ACCU-CHEK AVIVA) test strip Test  glucose 4 times a day  . [DISCONTINUED] lisinopril-hydrochlorothiazide (PRINZIDE,ZESTORETIC) 20-25 MG tablet Take 1 tablet by mouth daily.  . [DISCONTINUED] metFORMIN (GLUCOPHAGE) 500 MG tablet Take 1 tablet (500 mg total) by mouth 2 (two) times daily with a meal. (Patient not taking: Reported on 03/28/2016)   No facility-administered encounter medications on file as of 10/21/2017.    ALLERGIES: Allergies  Allergen Reactions  . Penicillins Other (See Comments)    unknown   VACCINATION STATUS: Immunization History  Administered Date(s) Administered  . Tdap 06/18/2012    Diabetes  He presents for his follow-up diabetic visit. He has type 2 diabetes mellitus. Onset time: He was diagnosed at age 28 years. He is high school weight was 320 pounds. He was likely misdiagnosed as type I and was treated with various cognitions of insulin over the years. His disease course has been worsening. There are no hypoglycemic associated symptoms. Pertinent negatives for hypoglycemia include no headaches, seizures or tremors. Associated symptoms include blurred vision, polydipsia, polyuria and visual change. Pertinent negatives for diabetes include no chest pain. There are no hypoglycemic complications. Symptoms are worsening. There are no diabetic complications. (Morbidly obese, chronic heavy smoker, sedentary life. ) Risk factors for coronary artery disease include diabetes mellitus, dyslipidemia, family history, male sex, obesity, tobacco exposure, sedentary lifestyle and hypertension. When asked about current treatments, none (Since there was interruption in his insurance, he did  not take any therapy for diabetes for 3 weeks.) were reported. His weight is decreasing steadily. He is following a generally unhealthy diet. When asked about meal planning, he reported none. He has had a previous visit with a dietitian. He participates in exercise intermittently. (Patient came with no meter no logs.  He missed his several  appointments.  His A1c is 11.1% from September 17, 2017.) An ACE inhibitor/angiotensin II receptor blocker is not being taken. He does not see a podiatrist.Eye exam is not current.  Hypertension  This is a chronic problem. The current episode started more than 1 year ago. The problem is uncontrolled. Associated symptoms include blurred vision. Pertinent negatives include no chest pain, headaches, palpitations or shortness of breath. Risk factors for coronary artery disease include diabetes mellitus, obesity, male gender, family history, smoking/tobacco exposure and sedentary lifestyle. Past treatments include ACE inhibitors (He is not on any antihypertensive medications at this point.). Compliance problems include diet.      Review of Systems  Constitutional: Negative for chills and fever.  Eyes: Positive for blurred vision.  Respiratory: Negative for cough and shortness of breath.   Cardiovascular: Negative for chest pain and palpitations.       No Shortness of breath  Gastrointestinal: Negative for abdominal pain, diarrhea, nausea and vomiting.  Endocrine: Positive for polydipsia and polyuria.  Genitourinary: Negative for frequency, hematuria and urgency.  Musculoskeletal: Negative for myalgias.  Skin: Negative for rash.       Has extensive acanthosis nigricans, on his neck, armpits, groins, and joints.  Allergic/Immunologic: Positive for environmental allergies.  Neurological: Negative for tremors, seizures and headaches.  Psychiatric/Behavioral: Negative for hallucinations and suicidal ideas.    Objective:    BP 133/89   Pulse 77   Ht 6' 3" (1.905 m)   Wt 297 lb (134.7 kg)   BMI 37.12 kg/m   Wt Readings from Last 3 Encounters:  10/21/17 297 lb (134.7 kg)  03/28/16 (!) 309 lb 9.6 oz (140.4 kg)  03/22/16 (!) 308 lb 2 oz (139.8 kg)    Physical Exam  Constitutional: He is oriented to person, place, and time. He appears well-developed. He is cooperative. No distress.  HENT:  Head:  Normocephalic and atraumatic.  Eyes: EOM are normal.  Neck: Normal range of motion. Neck supple. No tracheal deviation present. No thyromegaly present.  Cardiovascular: Normal rate, S1 normal, S2 normal and normal heart sounds. Exam reveals no gallop.  No murmur heard. Pulses:      Dorsalis pedis pulses are 1+ on the right side, and 1+ on the left side.       Posterior tibial pulses are 1+ on the right side, and 1+ on the left side.  Pulmonary/Chest: Breath sounds normal. No respiratory distress. He has no wheezes.  Abdominal: Soft. Bowel sounds are normal. He exhibits no distension. There is no tenderness. There is no guarding and no CVA tenderness.  Musculoskeletal: He exhibits no edema.       Right shoulder: He exhibits no swelling and no deformity.  Neurological: He is alert and oriented to person, place, and time. He has normal strength and normal reflexes. No cranial nerve deficit or sensory deficit. Gait normal.  Skin: Skin is warm and dry. No rash noted. No cyanosis. Nails show no clubbing.  Acanthosis nigricans.  Psychiatric: He has a normal mood and affect. His speech is normal. Cognition and memory are normal.     CMP ( most recent) CMP     Component Value  Date/Time   NA 139 06/18/2012 0546   K 3.8 06/18/2012 0546   CL 100 06/18/2012 0546   CO2 24 06/18/2012 0543   GLUCOSE 316 (H) 06/18/2012 0546   BUN 14 06/18/2012 0546   CREATININE 0.94 06/18/2012 0949   CALCIUM 9.6 06/18/2012 0543   PROT 7.8 06/18/2012 0543   ALBUMIN 3.8 06/18/2012 0543   AST 96 (H) 06/18/2012 0543   ALT 85 (H) 06/18/2012 0543   ALKPHOS 95 06/18/2012 0543   BILITOT 0.5 06/18/2012 0543   GFRNONAA >90 06/18/2012 0949   GFRAA >90 06/18/2012 0949     Diabetic Labs (most recent): Lab Results  Component Value Date   HGBA1C 11.1 09/17/2017   HGBA1C 10 02/06/2016   HGBA1C 8.3 09/01/2013     Assessment & Plan:   1. Uncontrolled type 2 diabetes mellitus with complication, without long-term  current use of insulin ([place])  - Patient has currently uncontrolled symptomatic type 2 DM since  27 years of age. -After missing his several appointments, he returns with higher A1c of 11.1% with no meter nor logs.   His diabetes is complicated by morbid obesity and patient remains at a high risk for more acute and chronic complications of diabetes which include CAD, CVA, CKD, retinopathy, and neuropathy. These are all discussed in detail with the patient.  - I have counseled the patient on diet management and weight loss, by adopting a carbohydrate restricted/protein rich diet.  -  Suggestion is made for him to avoid simple carbohydrates  from his diet including Cakes, Sweet Desserts / Pastries, Ice Cream, Soda (diet and regular), Sweet Tea, Candies, Chips, Cookies, Store Bought Juices, Alcohol in Excess of  1-2 drinks a day, Artificial Sweeteners, and "Sugar-free" Products. This will help patient to have stable blood glucose profile and potentially avoid unintended weight gain.  - I encouraged the patient to switch to  unprocessed or minimally processed complex starch and increased protein intake (animal or plant source), fruits, and vegetables.  - Patient is advised to stick to a routine mealtimes to eat 3 meals  a day and avoid unnecessary snacks ( to snack only to correct hypoglycemia).  - The patient will be scheduled with Jearld Fenton, RDN, CDE for individualized DM education.  - I have approached patient with the following individualized plan to manage diabetes and patient agrees:    -He just got a new insurance and wants a prescription for Synjardy. -This patient will require insulin treatment in order for him to achieve control of diabetes target. -However, it is essential to assure his commitment for proper monitoring of blood glucose. -I approached him to initiate strict monitoring of blood glucose 4 times a day- before meals and at bedtime and return in 1 week with his meter and  logs. - In the meantime, I will refill his Synjardy 12.12/998 mg extended release once a day with breakfast.    - Patient specific target  A1c;  LDL, HDL, Triglycerides, and  Waist Circumference were discussed in detail.  2) BP/HTN: His blood pressure is controlled to target.  I advised him to continue lisinopril/HCTZ 20/25 mg p.o. daily.   3) Lipids/HPL:  Lipid levels unknown, he is not on statins. I will obtain fasting lipid panel on subsequent visits. 4)  Weight/Diet: CDE Consult has been  initiated , exercise, and detailed carbohydrates information provided.  5) Chronic Care/Health Maintenance:  -Patient is started on ACEI medications and encouraged to continue to follow up with Ophthalmology, Podiatrist at least  yearly or according to recommendations, and advised to  Quit smoking. I have recommended yearly flu vaccine and pneumonia vaccination at least every 5 years; moderate intensity exercise for up to 150 minutes weekly; and  sleep for at least 7 hours a day.   - I advised patient to maintain close follow up with Glenda Chroman, MD for primary care needs.  - Time spent with the patient: 25 min, of which >50% was spent in reviewing his  current and  previous labs, previous treatments, and medications  doses and developing a plan for long-term care.   Follow up plan: - Return in about 1 week (around 10/28/2017) for follow up with meter and logs- no labs.  Glade Lloyd, MD Phone: [phone]  Fax: [phone]   10/21/2017, 4:07 PM

## 2017-10-21 NOTE — Patient Instructions (Signed)

## 2017-10-25 ENCOUNTER — Other Ambulatory Visit: Payer: Self-pay | Admitting: "Endocrinology

## 2017-10-25 MED ORDER — SITAGLIP PHOS-METFORMIN HCL ER 50-1000 MG PO TB24: 1.0000 | ORAL_TABLET | Freq: Every day | ORAL | 3 refills | Status: DC

## 2017-10-28 ENCOUNTER — Ambulatory Visit: Payer: BLUE CROSS/BLUE SHIELD | Admitting: "Endocrinology

## 2017-11-04 ENCOUNTER — Encounter: Payer: Self-pay | Admitting: "Endocrinology

## 2017-11-04 ENCOUNTER — Ambulatory Visit: Payer: BLUE CROSS/BLUE SHIELD | Admitting: "Endocrinology

## 2017-11-04 VITALS — BP 132/85 | HR 74 | Ht 75.0 in | Wt 296.0 lb

## 2017-11-04 DIAGNOSIS — E1165 Type 2 diabetes mellitus with hyperglycemia: Secondary | ICD-10-CM

## 2017-11-04 DIAGNOSIS — IMO0002 Reserved for concepts with insufficient information to code with codable children: Secondary | ICD-10-CM

## 2017-11-04 DIAGNOSIS — E118 Type 2 diabetes mellitus with unspecified complications: Secondary | ICD-10-CM

## 2017-11-04 DIAGNOSIS — I10 Essential (primary) hypertension: Secondary | ICD-10-CM | POA: Diagnosis not present

## 2017-11-04 DIAGNOSIS — Z91199 Patient's noncompliance with other medical treatment and regimen due to unspecified reason: Secondary | ICD-10-CM

## 2017-11-04 DIAGNOSIS — Z9119 Patient's noncompliance with other medical treatment and regimen: Secondary | ICD-10-CM

## 2017-11-04 MED ORDER — EMPAGLIFLOZIN-METFORMIN HCL ER 12.5-1000 MG PO TB24: 1.0000 | ORAL_TABLET | Freq: Every day | ORAL | 3 refills | Status: DC

## 2017-11-04 MED ORDER — INSULIN PEN NEEDLE 31G X 8 MM MISC: 1.0000 | 3 refills | Status: AC

## 2017-11-04 MED ORDER — INSULIN DEGLUDEC 100 UNIT/ML ~~LOC~~ SOPN: 20.0000 [IU] | PEN_INJECTOR | Freq: Every day | SUBCUTANEOUS | 2 refills | Status: DC

## 2017-11-04 NOTE — Progress Notes (Signed)
Subjective:    Patient ID: Stanley Ramos, male    DOB: 12/23/1990. Patient is being seen in f/u for management of diabetes requested by  Glenda Chroman, MD  Past Medical History:  Diagnosis Date  . Diabetes mellitus without complication ([place])   . DM (diabetes mellitus) ([place])   . Hypertension    Past Surgical History:  Procedure Laterality Date  . NO PAST SURGERIES     Social History   Socioeconomic History  . Marital status: Single    Spouse name: None  . Number of children: None  . Years of education: None  . Highest education level: None  Social Needs  . Financial resource strain: None  . Food insecurity - worry: None  . Food insecurity - inability: None  . Transportation needs - medical: None  . Transportation needs - non-medical: None  Occupational History  . None  Tobacco Use  . Smoking status: Former Smoker    Years: 0.50    Types: Cigarettes    Last attempt to quit: 09/02/2011    Years since quitting: 6.1  . Smokeless tobacco: Never Used  Substance and Sexual Activity  . Alcohol use: Yes    Comment: social  . Drug use: No  . Sexual activity: None  Other Topics Concern  . None  Social History Narrative   ** Merged History Encounter **       Single; student; gets regular exercise.    Outpatient Encounter Medications as of 11/04/2017  Medication Sig  . Blood Glucose Monitoring Suppl (ACCU-CHEK GUIDE) w/Device KIT 1 Piece by Does not apply route as directed.  . Empagliflozin-metFORMIN HCl ER (SYNJARDY XR) 12.12-998 MG TB24 Take 1 tablet by mouth daily after breakfast.  . glucose blood (ACCU-CHEK GUIDE) test strip Use as instructed  . insulin degludec (TRESIBA FLEXTOUCH) 100 UNIT/ML SOPN FlexTouch Pen Inject 0.2 mLs (20 Units total) into the skin daily at 10 pm.  . Insulin Pen Needle (B-D ULTRAFINE III SHORT PEN) 31G X 8 MM MISC 1 each by Does not apply route as directed.  . [DISCONTINUED] SitaGLIPtin-MetFORMIN HCl 50-1000 MG TB24 Take 1 tablet by mouth  daily after breakfast.   No facility-administered encounter medications on file as of 11/04/2017.    ALLERGIES: Allergies  Allergen Reactions  . Penicillins Other (See Comments)    unknown   VACCINATION STATUS: Immunization History  Administered Date(s) Administered  . Tdap 06/18/2012    Diabetes  He presents for his follow-up diabetic visit. He has type 2 diabetes mellitus. Onset time: He was diagnosed at age 70 years. He is high school weight was 320 pounds. He was likely misdiagnosed as type I and was treated with various cognitions of insulin over the years. His disease course has been worsening. There are no hypoglycemic associated symptoms. Pertinent negatives for hypoglycemia include no headaches, seizures or tremors. Associated symptoms include blurred vision, polydipsia and polyuria. Pertinent negatives for diabetes include no chest pain and no visual change. There are no hypoglycemic complications. Symptoms are worsening. There are no diabetic complications. (Morbidly obese, chronic heavy smoker, sedentary life. ) Risk factors for coronary artery disease include diabetes mellitus, dyslipidemia, family history, male sex, obesity, tobacco exposure, sedentary lifestyle and hypertension. When asked about current treatments, none (Since there was interruption in his insurance, he did not take any therapy for diabetes for 3 weeks.) were reported. His weight is stable. He is following a generally unhealthy diet. When asked about meal planning, he reported none.  He has had a previous visit with a dietitian. He participates in exercise intermittently. His overall blood glucose range is >200 mg/dl. (Patient came with meter showing average blood glucose of 257 monitoring 2 times a day.  His A1c is 11.1% from September 17, 2017.) An ACE inhibitor/angiotensin II receptor blocker is not being taken. He does not see a podiatrist.Eye exam is not current.  Hypertension  This is a chronic problem. The  current episode started more than 1 year ago. The problem is uncontrolled. Associated symptoms include blurred vision. Pertinent negatives include no chest pain, headaches, palpitations or shortness of breath. Risk factors for coronary artery disease include diabetes mellitus, obesity, male gender, family history, smoking/tobacco exposure and sedentary lifestyle. Past treatments include ACE inhibitors (He is not on any antihypertensive medications at this point.). Compliance problems include diet.      Review of Systems  Constitutional: Negative for chills and fever.  Eyes: Positive for blurred vision.  Respiratory: Negative for cough and shortness of breath.   Cardiovascular: Negative for chest pain and palpitations.       No Shortness of breath  Gastrointestinal: Negative for abdominal pain, diarrhea, nausea and vomiting.  Endocrine: Positive for polydipsia and polyuria.  Genitourinary: Negative for frequency, hematuria and urgency.  Musculoskeletal: Negative for myalgias.  Skin: Negative for rash.       Has extensive acanthosis nigricans, on his neck, armpits, groins, and joints.  Allergic/Immunologic: Positive for environmental allergies.  Neurological: Negative for tremors, seizures and headaches.  Psychiatric/Behavioral: Negative for hallucinations and suicidal ideas.    Objective:    BP 132/85   Pulse 74   Ht '6\' 3"'$  (1.905 m)   Wt 296 lb (134.3 kg)   BMI 37.00 kg/m   Wt Readings from Last 3 Encounters:  11/04/17 296 lb (134.3 kg)  10/21/17 297 lb (134.7 kg)  03/28/16 (!) 309 lb 9.6 oz (140.4 kg)    Physical Exam  Constitutional: He is oriented to person, place, and time. He appears well-developed. He is cooperative. No distress.  HENT:  Head: Normocephalic and atraumatic.  Eyes: EOM are normal.  Neck: Normal range of motion. Neck supple. No tracheal deviation present. No thyromegaly present.  Cardiovascular: Normal rate, S1 normal, S2 normal and normal heart sounds. Exam  reveals no gallop.  No murmur heard. Pulses:      Dorsalis pedis pulses are 1+ on the right side, and 1+ on the left side.       Posterior tibial pulses are 1+ on the right side, and 1+ on the left side.  Pulmonary/Chest: Breath sounds normal. No respiratory distress. He has no wheezes.  Abdominal: Soft. Bowel sounds are normal. He exhibits no distension. There is no tenderness. There is no guarding and no CVA tenderness.  Musculoskeletal: He exhibits no edema.       Right shoulder: He exhibits no swelling and no deformity.  Neurological: He is alert and oriented to person, place, and time. He has normal strength and normal reflexes. No cranial nerve deficit or sensory deficit. Gait normal.  Skin: Skin is warm and dry. No rash noted. No cyanosis. Nails show no clubbing.  Acanthosis nigricans.  Psychiatric: He has a normal mood and affect. His speech is normal. Cognition and memory are normal.     CMP     Component Value Date/Time   NA 139 06/18/2012 0546   K 3.8 06/18/2012 0546   CL 100 06/18/2012 0546   CO2 24 06/18/2012 0543   GLUCOSE  316 (H) 06/18/2012 0546   BUN 14 06/18/2012 0546   CREATININE 0.94 06/18/2012 0949   CALCIUM 9.6 06/18/2012 0543   PROT 7.8 06/18/2012 0543   ALBUMIN 3.8 06/18/2012 0543   AST 96 (H) 06/18/2012 0543   ALT 85 (H) 06/18/2012 0543   ALKPHOS 95 06/18/2012 0543   BILITOT 0.5 06/18/2012 0543   GFRNONAA >90 06/18/2012 0949   GFRAA >90 06/18/2012 0949     Diabetic Labs (most recent): Lab Results  Component Value Date   HGBA1C 11.1 09/17/2017   HGBA1C 10 02/06/2016   HGBA1C 8.3 09/01/2013     Assessment & Plan:   1. Uncontrolled type 2 diabetes mellitus with complication, without long-term current use of insulin ([place])  - Patient has currently uncontrolled symptomatic type 2 DM since  27 years of age. -After missing his several appointments, he recently returned with  higher A1c of 11.1% . -Since then, his meter average blood glucose  readings are 257.  His diabetes is complicated by morbid obesity and patient remains at a high risk for more acute and chronic complications of diabetes which include CAD, CVA, CKD, retinopathy, and neuropathy. These are all discussed in detail with the patient.  - I have counseled the patient on diet management and weight loss, by adopting a carbohydrate restricted/protein rich diet.  -  Suggestion is made for him to avoid simple carbohydrates  from his diet including Cakes, Sweet Desserts / Pastries, Ice Cream, Soda (diet and regular), Sweet Tea, Candies, Chips, Cookies, Store Bought Juices, Alcohol in Excess of  1-2 drinks a day, Artificial Sweeteners, and "Sugar-free" Products. This will help patient to have stable blood glucose profile and potentially avoid unintended weight gain.   - I encouraged the patient to switch to  unprocessed or minimally processed complex starch and increased protein intake (animal or plant source), fruits, and vegetables.  - Patient is advised to stick to a routine mealtimes to eat 3 meals  a day and avoid unnecessary snacks ( to snack only to correct hypoglycemia).   - I have approached patient with the following individualized plan to manage diabetes and patient agrees:    -Based on his current glycemic burden, he is approached for insulin treatment and he agrees.    -I discussed and initiated Tresiba 20 units nightly, associated with strict monitoring of blood glucose 2 times a day-daily before breakfast and at bedtime.   -He states that his insurance would not cover Synjardy.  I encouraged him to use Synjardy 12.12/998 mg extended release once a day after breakfast.     - Patient specific target  A1c;  LDL, HDL, Triglycerides, and  Waist Circumference were discussed in detail.  2) BP/HTN: His blood pressure is controlled to target.    I advised him to continue lisinopril/HCTZ 20/25 mg p.o. daily.   3) Lipids/HPL:  Lipid levels unknown, he is not on statins.  I will obtain fasting lipid panel on subsequent visits. 4)  Weight/Diet: CDE Consult has been  initiated , exercise, and detailed carbohydrates information provided.  5) Chronic Care/Health Maintenance:  -Patient is started on ACEI medications and encouraged to continue to follow up with Ophthalmology, Podiatrist at least yearly or according to recommendations, and advised to stay away from smoking.  I have recommended yearly flu vaccine and pneumonia vaccination at least every 5 years; moderate intensity exercise for up to 150 minutes weekly; and  sleep for at least 7 hours a day.   - I advised patient  to maintain close follow up with Glenda Chroman, MD for primary care needs.  - Time spent with the patient: 25 min, of which >50% was spent in reviewing his blood glucose logs , discussing his hypo- and hyper-glycemic episodes, reviewing his current and  previous labs and insulin doses and developing a plan to avoid hypo- and hyper-glycemia. Please refer to Patient Instructions for Blood Glucose Monitoring and Insulin/Medications Dosing Guide"  in media tab for additional information. Hunt Oris Whiters participated in the discussions, expressed understanding, and voiced agreement with the above plans.  All questions were answered to his satisfaction. he is encouraged to contact clinic should he have any questions or concerns prior to his return visit.   Follow up plan: - Return in about 8 weeks (around 12/30/2017) for follow up with pre-visit labs, meter, and logs.  Glade Lloyd, MD Phone: [phone]  Fax: [phone]   11/04/2017, 4:10 PM

## 2017-11-04 NOTE — Patient Instructions (Signed)

## 2017-12-27 LAB — HEMOGLOBIN A1C
Hgb A1c MFr Bld: 9.6 % of total Hgb — ABNORMAL HIGH (ref <5.7)
MEAN PLASMA GLUCOSE: 229 (calc)
eAG (mmol/L): 12.7 (calc)

## 2017-12-27 LAB — VITAMIN D 25 HYDROXY (VIT D DEFICIENCY, FRACTURES): Vit D, 25-Hydroxy: 13 ng/mL — ABNORMAL LOW (ref 30–100)

## 2017-12-27 LAB — COMPLETE METABOLIC PANEL WITH GFR
AG RATIO: 1.5 (calc) (ref 1.0–2.5)
ALBUMIN MSPROF: 4.3 g/dL (ref 3.6–5.1)
ALT: 34 U/L (ref 9–46)
AST: 21 U/L (ref 10–40)
Alkaline phosphatase (APISO): 72 U/L (ref 40–115)
BUN: 15 mg/dL (ref 7–25)
CO2: 29 mmol/L (ref 20–32)
CREATININE: 1 mg/dL (ref 0.60–1.35)
Calcium: 10.1 mg/dL (ref 8.6–10.3)
Chloride: 101 mmol/L (ref 98–110)
GFR, Est African American: 120 mL/min/{1.73_m2} (ref >=60)
GFR, Est Non African American: 103 mL/min/{1.73_m2} (ref >=60)
Globulin: 2.9 g/dL (calc) (ref 1.9–3.7)
Glucose, Bld: 234 mg/dL — ABNORMAL HIGH (ref 65–139)
POTASSIUM: 4.5 mmol/L (ref 3.5–5.3)
SODIUM: 137 mmol/L (ref 135–146)
TOTAL PROTEIN: 7.2 g/dL (ref 6.1–8.1)
Total Bilirubin: 0.7 mg/dL (ref 0.2–1.2)

## 2017-12-27 LAB — T4, FREE: FREE T4: 1.3 ng/dL (ref 0.8–1.8)

## 2017-12-27 LAB — MICROALBUMIN / CREATININE URINE RATIO
Creatinine, Urine: 178 mg/dL (ref 20–320)
Microalb Creat Ratio: 229 mcg/mg creat — ABNORMAL HIGH (ref <30)
Microalb, Ur: 40.7 mg/dL

## 2017-12-27 LAB — TSH: TSH: 1.07 mIU/L (ref 0.40–4.50)

## 2017-12-30 ENCOUNTER — Ambulatory Visit (INDEPENDENT_AMBULATORY_CARE_PROVIDER_SITE_OTHER): Payer: BLUE CROSS/BLUE SHIELD | Admitting: "Endocrinology

## 2017-12-30 ENCOUNTER — Encounter: Payer: Self-pay | Admitting: "Endocrinology

## 2017-12-30 VITALS — BP 133/84 | HR 68 | Ht 75.0 in | Wt 306.0 lb

## 2017-12-30 DIAGNOSIS — E118 Type 2 diabetes mellitus with unspecified complications: Secondary | ICD-10-CM

## 2017-12-30 DIAGNOSIS — I10 Essential (primary) hypertension: Secondary | ICD-10-CM | POA: Diagnosis not present

## 2017-12-30 DIAGNOSIS — IMO0002 Reserved for concepts with insufficient information to code with codable children: Secondary | ICD-10-CM

## 2017-12-30 DIAGNOSIS — E1165 Type 2 diabetes mellitus with hyperglycemia: Secondary | ICD-10-CM | POA: Diagnosis not present

## 2017-12-30 MED ORDER — INSULIN DEGLUDEC 100 UNIT/ML ~~LOC~~ SOPN: 40.0000 [IU] | PEN_INJECTOR | Freq: Every day | SUBCUTANEOUS | 2 refills | Status: DC

## 2017-12-30 MED ORDER — METFORMIN HCL 1000 MG PO TABS: 1000.0000 mg | ORAL_TABLET | Freq: Two times a day (BID) | ORAL | 6 refills | Status: DC

## 2017-12-30 NOTE — Progress Notes (Signed)
Subjective:    Patient ID: Stanley Ramos, male    DOB: 11-17-1990. Patient is being seen in f/u for management of diabetes requested by  Glenda Chroman, MD  Past Medical History:  Diagnosis Date  . Diabetes mellitus without complication ([place])   . DM (diabetes mellitus) ([place])   . Hypertension    Past Surgical History:  Procedure Laterality Date  . NO PAST SURGERIES     Social History   Socioeconomic History  . Marital status: Single    Spouse name: Not on file  . Number of children: Not on file  . Years of education: Not on file  . Highest education level: Not on file  Occupational History  . Not on file  Social Needs  . Financial resource strain: Not on file  . Food insecurity:    Worry: Not on file    Inability: Not on file  . Transportation needs:    Medical: Not on file    Non-medical: Not on file  Tobacco Use  . Smoking status: Former Smoker    Years: 0.50    Types: Cigarettes    Last attempt to quit: 09/02/2011    Years since quitting: 6.3  . Smokeless tobacco: Never Used  Substance and Sexual Activity  . Alcohol use: Yes    Comment: social  . Drug use: No  . Sexual activity: Not on file  Lifestyle  . Physical activity:    Days per week: Not on file    Minutes per session: Not on file  . Stress: Not on file  Relationships  . Social connections:    Talks on phone: Not on file    Gets together: Not on file    Attends religious service: Not on file    Active member of club or organization: Not on file    Attends meetings of clubs or organizations: Not on file    Relationship status: Not on file  Other Topics Concern  . Not on file  Social History Narrative   ** Merged History Encounter **       Single; student; gets regular exercise.    Outpatient Encounter Medications as of 12/30/2017  Medication Sig  . metFORMIN (GLUCOPHAGE) 1000 MG tablet Take 1 tablet (1,000 mg total) by mouth 2 (two) times daily with a meal.  . [DISCONTINUED] metFORMIN  (GLUCOPHAGE) 1000 MG tablet Take 1,000 mg by mouth 2 (two) times daily with a meal.  . Blood Glucose Monitoring Suppl (ACCU-CHEK GUIDE) w/Device KIT 1 Piece by Does not apply route as directed.  . insulin degludec (TRESIBA FLEXTOUCH) 100 UNIT/ML SOPN FlexTouch Pen Inject 0.4 mLs (40 Units total) into the skin daily at 10 pm.  . Insulin Pen Needle (B-D ULTRAFINE III SHORT PEN) 31G X 8 MM MISC 1 each by Does not apply route as directed.  . [DISCONTINUED] Empagliflozin-metFORMIN HCl ER (SYNJARDY XR) 12.12-998 MG TB24 Take 1 tablet by mouth daily after breakfast.  . [DISCONTINUED] glucose blood (ACCU-CHEK GUIDE) test strip Use as instructed  . [DISCONTINUED] insulin degludec (TRESIBA FLEXTOUCH) 100 UNIT/ML SOPN FlexTouch Pen Inject 0.2 mLs (20 Units total) into the skin daily at 10 pm. (Patient taking differently: Inject 20 Units into the skin 2 (two) times daily. )   No facility-administered encounter medications on file as of 12/30/2017.    ALLERGIES: Allergies  Allergen Reactions  . Penicillins Other (See Comments)    unknown   VACCINATION STATUS: Immunization History  Administered Date(s) Administered  .  Tdap 06/18/2012    Diabetes  He presents for his follow-up diabetic visit. He has type 2 diabetes mellitus. Onset time: He was diagnosed at age 70 years. He is high school weight was 320 pounds. He was likely misdiagnosed as type I and was treated with various cognitions of insulin over the years. His disease course has been improving. There are no hypoglycemic associated symptoms. Pertinent negatives for hypoglycemia include no headaches, seizures or tremors. Associated symptoms include blurred vision. Pertinent negatives for diabetes include no chest pain, no polydipsia, no polyuria and no visual change. There are no hypoglycemic complications. Symptoms are improving. There are no diabetic complications. (Morbidly obese, chronic heavy smoker, sedentary life. ) Risk factors for coronary  artery disease include diabetes mellitus, dyslipidemia, family history, male sex, obesity, tobacco exposure, sedentary lifestyle and hypertension. When asked about current treatments, none (Since there was interruption in his insurance, he did not take any therapy for diabetes for 3 weeks.) were reported. His weight is increasing steadily. He is following a generally unhealthy diet. When asked about meal planning, he reported none. He has had a previous visit with a dietitian. He participates in exercise intermittently. His overall blood glucose range is >200 mg/dl. (Patient came with meter showing average blood glucose of 224 proving from 257.   -His A1c has improved to 9.6% from 11.1%.   ) An ACE inhibitor/angiotensin II receptor blocker is not being taken. He does not see a podiatrist.Eye exam is not current.  Hypertension  This is a chronic problem. The current episode started more than 1 year ago. The problem is uncontrolled. Associated symptoms include blurred vision. Pertinent negatives include no chest pain, headaches, palpitations or shortness of breath. Risk factors for coronary artery disease include diabetes mellitus, obesity, male gender, family history, smoking/tobacco exposure and sedentary lifestyle. Past treatments include ACE inhibitors (He is not on any antihypertensive medications at this point.). Compliance problems include diet.      Review of Systems  Constitutional: Negative for chills and fever.  Eyes: Positive for blurred vision.  Respiratory: Negative for cough and shortness of breath.   Cardiovascular: Negative for chest pain and palpitations.       No Shortness of breath  Gastrointestinal: Negative for abdominal pain, diarrhea, nausea and vomiting.  Endocrine: Negative for polydipsia and polyuria.  Genitourinary: Negative for frequency, hematuria and urgency.  Musculoskeletal: Negative for myalgias.  Skin: Negative for rash.       Has extensive acanthosis nigricans, on  his neck, armpits, groins, and joints.  Allergic/Immunologic: Positive for environmental allergies.  Neurological: Negative for tremors, seizures and headaches.  Psychiatric/Behavioral: Negative for hallucinations and suicidal ideas.    Objective:    BP 133/84   Pulse 68   Ht _0  (1.905 m)   Wt (!) 306 lb (138.8 kg)   BMI 38.25 kg/m   Wt Readings from Last 3 Encounters:  12/30/17 (!) 306 lb (138.8 kg)  11/04/17 296 lb (134.3 kg)  10/21/17 297 lb (134.7 kg)    Physical Exam  Constitutional: He is oriented to person, place, and time. He appears well-developed. He is cooperative. No distress.  HENT:  Head: Normocephalic and atraumatic.  Eyes: EOM are normal.  Neck: Normal range of motion. Neck supple. No tracheal deviation present. No thyromegaly present.  Cardiovascular: Normal rate, S1 normal, S2 normal and normal heart sounds. Exam reveals no gallop.  No murmur heard. Pulses:      Dorsalis pedis pulses are 1+ on the right side,  and 1+ on the left side.       Posterior tibial pulses are 1+ on the right side, and 1+ on the left side.  Pulmonary/Chest: Breath sounds normal. No respiratory distress. He has no wheezes.  Abdominal: Soft. Bowel sounds are normal. He exhibits no distension. There is no tenderness. There is no guarding and no CVA tenderness.  Musculoskeletal: He exhibits no edema.       Right shoulder: He exhibits no swelling and no deformity.  Neurological: He is alert and oriented to person, place, and time. He has normal strength and normal reflexes. No cranial nerve deficit or sensory deficit. Gait normal.  Skin: Skin is warm and dry. No rash noted. No cyanosis. Nails show no clubbing.  Acanthosis nigricans.  Psychiatric: He has a normal mood and affect. His speech is normal. Cognition and memory are normal.    CMP     Component Value Date/Time   NA 137 12/26/2017 0909   K 4.5 12/26/2017 0909   CL 101 12/26/2017 0909   CO2 29 12/26/2017 0909   GLUCOSE 234  (H) 12/26/2017 0909   BUN 15 12/26/2017 0909   CREATININE 1.00 12/26/2017 0909   CALCIUM 10.1 12/26/2017 0909   PROT 7.2 12/26/2017 0909   ALBUMIN 3.8 06/18/2012 0543   AST 21 12/26/2017 0909   ALT 34 12/26/2017 0909   ALKPHOS 95 06/18/2012 0543   BILITOT 0.7 12/26/2017 0909   GFRNONAA 103 12/26/2017 0909   GFRAA 120 12/26/2017 0909     Diabetic Labs (most recent): Lab Results  Component Value Date   HGBA1C 9.6 (H) 12/26/2017   HGBA1C 11.1 09/17/2017   HGBA1C 10 02/06/2016     Assessment & Plan:   1. Uncontrolled type 2 diabetes mellitus with complication, without long-term current use of insulin ([place])  - Patient has currently uncontrolled symptomatic type 2 DM since  27 years of age. -He did not document his insulin activities.  His A1c has improved to 9.6% from 11.1%.   -His meter reveals average blood glucose of 224 over the last month.  His diabetes is complicated by morbid obesity and patient remains at a high risk for more acute and chronic complications of diabetes which include CAD, CVA, CKD, retinopathy, and neuropathy. These are all discussed in detail with the patient.  - I have counseled the patient on diet management and weight loss, by adopting a carbohydrate restricted/protein rich diet.  -  Suggestion is made for him to avoid simple carbohydrates  from his diet including Cakes, Sweet Desserts / Pastries, Ice Cream, Soda (diet and regular), Sweet Tea, Candies, Chips, Cookies, Store Bought Juices, Alcohol in Excess of  1-2 drinks a day, Artificial Sweeteners, and "Sugar-free" Products. This will help patient to have stable blood glucose profile and potentially avoid unintended weight gain.  - I encouraged the patient to switch to  unprocessed or minimally processed complex starch and increased protein intake (animal or plant source), fruits, and vegetables.  - Patient is advised to stick to a routine mealtimes to eat 3 meals  a day and avoid unnecessary snacks (  to snack only to correct hypoglycemia).   - I have approached patient with the following individualized plan to manage diabetes and patient agrees:    -He is responding to basal insulin.  I advised him to increase his [place] to 40 units nightly associated with strict monitoring of blood glucose daily before breakfast, and at any other time as needed.  He is advised  to call if he registers blood glucose readings above 200 mg/dL fasting. -He will continue metformin 1000 mg p.o. twice daily. - Patient specific target  A1c;  LDL, HDL, Triglycerides, and  Waist Circumference were discussed in detail.  2) BP/HTN: His blood pressure is controlled to target.  He is advised to continue his current blood pressure medications including lisinopril/HCTZ 20/25 mg p.o. daily.   3) Lipids/HPL:  Lipid levels unknown, he is not on statins. I will obtain fasting lipid panel on subsequent visits. 4)  Weight/Diet: CDE Consult has been  initiated , exercise, and detailed carbohydrates information provided.  5) Chronic Care/Health Maintenance:  -Patient is started on ACEI medications and encouraged to continue to follow up with Ophthalmology, Podiatrist at least yearly or according to recommendations, and advised to stay away from smoking.  I have recommended yearly flu vaccine and pneumonia vaccination at least every 5 years; moderate intensity exercise for up to 150 minutes weekly; and  sleep for at least 7 hours a day.   - I advised patient to maintain close follow up with Glenda Chroman, MD for primary care needs.  - Time spent with the patient: 25 min, of which >50% was spent in reviewing his blood glucose logs , discussing his hypo- and hyper-glycemic episodes, reviewing his current and  previous labs and insulin doses and developing a plan to avoid hypo- and hyper-glycemia. Please refer to Patient Instructions for Blood Glucose Monitoring and Insulin/Medications Dosing Guide"  in media tab for additional  information. Hunt Oris Derderian participated in the discussions, expressed understanding, and voiced agreement with the above plans.  All questions were answered to his satisfaction. he is encouraged to contact clinic should he have any questions or concerns prior to his return visit.   Follow up plan: - Return in about 3 months (around 04/01/2018) for follow up with pre-visit labs, meter, and logs.  Glade Lloyd, MD Phone: [phone]  Fax: [phone]   12/30/2017, 5:09 PM

## 2017-12-30 NOTE — Patient Instructions (Signed)

## 2018-04-01 ENCOUNTER — Ambulatory Visit: Payer: BLUE CROSS/BLUE SHIELD | Admitting: "Endocrinology

## 2018-04-01 ENCOUNTER — Telehealth: Payer: Self-pay | Admitting: "Endocrinology

## 2018-04-01 MED ORDER — INSULIN DEGLUDEC 100 UNIT/ML ~~LOC~~ SOPN: 40.0000 [IU] | PEN_INJECTOR | Freq: Every day | SUBCUTANEOUS | 2 refills | Status: DC

## 2018-04-01 MED ORDER — METFORMIN HCL 1000 MG PO TABS: 1000.0000 mg | ORAL_TABLET | Freq: Two times a day (BID) | ORAL | 3 refills | Status: AC

## 2018-04-01 NOTE — Telephone Encounter (Signed)
Stanley Ramos is calling stating he is completely out of metFORMIN (GLUCOPHAGE) 1000 MG tablet and insulin degludec (TRESIBA FLEXTOUCH) 100 UNIT/ML SOPN FlexTouch Pen please advise?

## 2018-04-02 LAB — COMPLETE METABOLIC PANEL WITH GFR
AG RATIO: 1.4 (calc) (ref 1.0–2.5)
ALBUMIN MSPROF: 3.9 g/dL (ref 3.6–5.1)
ALKALINE PHOSPHATASE (APISO): 53 U/L (ref 40–115)
ALT: 51 U/L — ABNORMAL HIGH (ref 9–46)
AST: 27 U/L (ref 10–40)
BILIRUBIN TOTAL: 0.5 mg/dL (ref 0.2–1.2)
BUN: 16 mg/dL (ref 7–25)
CHLORIDE: 103 mmol/L (ref 98–110)
CO2: 26 mmol/L (ref 20–32)
Calcium: 9.5 mg/dL (ref 8.6–10.3)
Creat: 1.01 mg/dL (ref 0.60–1.35)
GFR, Est African American: 118 mL/min/{1.73_m2} (ref >=60)
GFR, Est Non African American: 102 mL/min/{1.73_m2} (ref >=60)
GLOBULIN: 2.8 g/dL (ref 1.9–3.7)
Glucose, Bld: 258 mg/dL — ABNORMAL HIGH (ref 65–139)
POTASSIUM: 4.2 mmol/L (ref 3.5–5.3)
SODIUM: 136 mmol/L (ref 135–146)
Total Protein: 6.7 g/dL (ref 6.1–8.1)

## 2018-04-02 LAB — HEMOGLOBIN A1C
HEMOGLOBIN A1C: 10 %{Hb} — AB (ref <5.7)
Mean Plasma Glucose: 240 (calc)
eAG (mmol/L): 13.3 (calc)

## 2018-04-24 ENCOUNTER — Ambulatory Visit (INDEPENDENT_AMBULATORY_CARE_PROVIDER_SITE_OTHER): Payer: BLUE CROSS/BLUE SHIELD | Admitting: "Endocrinology

## 2018-04-24 ENCOUNTER — Encounter: Payer: Self-pay | Admitting: "Endocrinology

## 2018-04-24 VITALS — BP 132/88 | HR 68 | Ht 75.0 in | Wt 307.0 lb

## 2018-04-24 DIAGNOSIS — I10 Essential (primary) hypertension: Secondary | ICD-10-CM | POA: Diagnosis not present

## 2018-04-24 DIAGNOSIS — E1165 Type 2 diabetes mellitus with hyperglycemia: Secondary | ICD-10-CM | POA: Diagnosis not present

## 2018-04-24 DIAGNOSIS — E118 Type 2 diabetes mellitus with unspecified complications: Secondary | ICD-10-CM | POA: Diagnosis not present

## 2018-04-24 DIAGNOSIS — IMO0002 Reserved for concepts with insufficient information to code with codable children: Secondary | ICD-10-CM

## 2018-04-24 MED ORDER — INSULIN DEGLUDEC 100 UNIT/ML ~~LOC~~ SOPN: 60.0000 [IU] | PEN_INJECTOR | Freq: Every day | SUBCUTANEOUS | 2 refills | Status: DC

## 2018-04-24 NOTE — Patient Instructions (Signed)

## 2018-04-24 NOTE — Progress Notes (Signed)
Endocrinology follow-up note   Subjective:    Patient ID: Stanley Ramos, male    DOB: 08-27-1990. Patient is being seen in f/u for management of currently uncontrolled type 2 diabetes, hypertension, obesity. PMD:   Glenda Chroman, MD  Past Medical History:  Diagnosis Date  . Diabetes mellitus without complication ([place])   . DM (diabetes mellitus) ([place])   . Hypertension    Past Surgical History:  Procedure Laterality Date  . NO PAST SURGERIES     Social History   Socioeconomic History  . Marital status: Single    Spouse name: Not on file  . Number of children: Not on file  . Years of education: Not on file  . Highest education level: Not on file  Occupational History  . Not on file  Social Needs  . Financial resource strain: Not on file  . Food insecurity:    Worry: Not on file    Inability: Not on file  . Transportation needs:    Medical: Not on file    Non-medical: Not on file  Tobacco Use  . Smoking status: Former Smoker    Years: 0.50    Types: Cigarettes    Last attempt to quit: 09/02/2011    Years since quitting: 6.6  . Smokeless tobacco: Never Used  Substance and Sexual Activity  . Alcohol use: Yes    Comment: social  . Drug use: No  . Sexual activity: Not on file  Lifestyle  . Physical activity:    Days per week: Not on file    Minutes per session: Not on file  . Stress: Not on file  Relationships  . Social connections:    Talks on phone: Not on file    Gets together: Not on file    Attends religious service: Not on file    Active member of club or organization: Not on file    Attends meetings of clubs or organizations: Not on file    Relationship status: Not on file  Other Topics Concern  . Not on file  Social History Narrative   ** Merged History Encounter **       Single; student; gets regular exercise.    Outpatient Encounter Medications as of 04/24/2018  Medication Sig  . Blood Glucose Monitoring Suppl (ACCU-CHEK GUIDE) w/Device KIT  1 Piece by Does not apply route as directed.  . insulin degludec (TRESIBA FLEXTOUCH) 100 UNIT/ML SOPN FlexTouch Pen Inject 0.6 mLs (60 Units total) into the skin daily at 10 pm.  . Insulin Pen Needle (B-D ULTRAFINE III SHORT PEN) 31G X 8 MM MISC 1 each by Does not apply route as directed.  . metFORMIN (GLUCOPHAGE) 1000 MG tablet Take 1 tablet (1,000 mg total) by mouth 2 (two) times daily with a meal.  . [DISCONTINUED] insulin degludec (TRESIBA FLEXTOUCH) 100 UNIT/ML SOPN FlexTouch Pen Inject 0.4 mLs (40 Units total) into the skin daily at 10 pm.   No facility-administered encounter medications on file as of 04/24/2018.    ALLERGIES: Allergies  Allergen Reactions  . Penicillins Other (See Comments)    unknown   VACCINATION STATUS: Immunization History  Administered Date(s) Administered  . Tdap 06/18/2012    Diabetes  He presents for his follow-up diabetic visit. He has type 2 diabetes mellitus. Onset time: He was diagnosed at age 30 years. He is high school weight was 320 pounds. He was likely misdiagnosed as type I and was treated with various cognitions of insulin over the  years. His disease course has been worsening. There are no hypoglycemic associated symptoms. Pertinent negatives for hypoglycemia include no headaches, seizures or tremors. Associated symptoms include blurred vision. Pertinent negatives for diabetes include no chest pain, no polydipsia, no polyuria and no visual change. There are no hypoglycemic complications. Symptoms are worsening. There are no diabetic complications. (Morbidly obese, chronic heavy smoker, sedentary life. ) Risk factors for coronary artery disease include diabetes mellitus, dyslipidemia, family history, male sex, obesity, tobacco exposure, sedentary lifestyle and hypertension. When asked about current treatments, none (Since there was interruption in his insurance, he did not take any therapy for diabetes for 3 weeks.) were reported. His weight is  fluctuating minimally. He is following a generally unhealthy diet. When asked about meal planning, he reported none. He has had a previous visit with a dietitian. He participates in exercise intermittently. His overall blood glucose range is >200 mg/dl. (Patient came with meter showing average blood glucose of 65.  His recent A1c is 10% unchanged from his last visit A1c of 9.6%.    ) An ACE inhibitor/angiotensin II receptor blocker is not being taken. He does not see a podiatrist.Eye exam is not current.  Hypertension  This is a chronic problem. The current episode started more than 1 year ago. The problem is uncontrolled. Associated symptoms include blurred vision. Pertinent negatives include no chest pain, headaches, palpitations or shortness of breath. Risk factors for coronary artery disease include diabetes mellitus, obesity, male gender, family history, smoking/tobacco exposure and sedentary lifestyle. Past treatments include ACE inhibitors (He is not on any antihypertensive medications at this point.). Compliance problems include diet.      Review of Systems  Constitutional: Negative for chills and fever.  Eyes: Positive for blurred vision.  Respiratory: Negative for cough and shortness of breath.   Cardiovascular: Negative for chest pain and palpitations.       No Shortness of breath  Gastrointestinal: Negative for abdominal pain, diarrhea, nausea and vomiting.  Endocrine: Negative for polydipsia and polyuria.  Genitourinary: Negative for frequency, hematuria and urgency.  Musculoskeletal: Negative for myalgias.  Skin: Negative for rash.       Has extensive acanthosis nigricans, on his neck, armpits, groins, and joints.  Allergic/Immunologic: Positive for environmental allergies.  Neurological: Negative for tremors, seizures and headaches.  Psychiatric/Behavioral: Negative for hallucinations and suicidal ideas.    Objective:    BP 132/88   Pulse 68   Ht _0  (1.905 m)   Wt (!)  307 lb (139.3 kg)   BMI 38.37 kg/m   Wt Readings from Last 3 Encounters:  04/24/18 (!) 307 lb (139.3 kg)  12/30/17 (!) 306 lb (138.8 kg)  11/04/17 296 lb (134.3 kg)    Physical Exam  Constitutional: He is oriented to person, place, and time. He appears well-developed. He is cooperative. No distress.  HENT:  Head: Normocephalic and atraumatic.  Eyes: EOM are normal.  Neck: Normal range of motion. Neck supple. No tracheal deviation present. No thyromegaly present.  Cardiovascular: Normal rate, S1 normal, S2 normal and normal heart sounds. Exam reveals no gallop.  No murmur heard. Pulses:      Dorsalis pedis pulses are 1+ on the right side, and 1+ on the left side.       Posterior tibial pulses are 1+ on the right side, and 1+ on the left side.  Pulmonary/Chest: Breath sounds normal. No respiratory distress. He has no wheezes.  Abdominal: Soft. Bowel sounds are normal. He exhibits no distension. There  is no tenderness. There is no guarding and no CVA tenderness.  Musculoskeletal: He exhibits no edema.       Right shoulder: He exhibits no swelling and no deformity.  Neurological: He is alert and oriented to person, place, and time. He has normal strength and normal reflexes. No cranial nerve deficit or sensory deficit. Gait normal.  Skin: Skin is warm and dry. No rash noted. No cyanosis. Nails show no clubbing.  Acanthosis nigricans.  Psychiatric: He has a normal mood and affect. His speech is normal. Cognition and memory are normal.    CMP     Component Value Date/Time   NA 136 04/01/2018 1037   K 4.2 04/01/2018 1037   CL 103 04/01/2018 1037   CO2 26 04/01/2018 1037   GLUCOSE 258 (H) 04/01/2018 1037   BUN 16 04/01/2018 1037   CREATININE 1.01 04/01/2018 1037   CALCIUM 9.5 04/01/2018 1037   PROT 6.7 04/01/2018 1037   ALBUMIN 3.8 06/18/2012 0543   AST 27 04/01/2018 1037   ALT 51 (H) 04/01/2018 1037   ALKPHOS 95 06/18/2012 0543   BILITOT 0.5 04/01/2018 1037   GFRNONAA 102  04/01/2018 1037   GFRAA 118 04/01/2018 1037     Diabetic Labs (most recent): Lab Results  Component Value Date   HGBA1C 10.0 (H) 04/01/2018   HGBA1C 9.6 (H) 12/26/2017   HGBA1C 11.1 09/17/2017     Assessment & Plan:   1. Uncontrolled type 2 diabetes mellitus with complication, without long-term current use of insulin ([place])  - Patient has currently uncontrolled symptomatic type 2 DM since  27 years of age. -He admits to have been changing around his Tresiba dose, his A1c is 10% increasing from 9.6%.  Brings in a meter showing average blood glucose of 265.     His diabetes is complicated by nonadherence to medical regimen and morbid obesity and patient remains at a high risk for more acute and chronic complications of diabetes which include CAD, CVA, CKD, retinopathy, and neuropathy. These are all discussed in detail with the patient.  - I have counseled the patient on diet management and weight loss, by adopting a carbohydrate restricted/protein rich diet.  -  Suggestion is made for him to avoid simple carbohydrates  from his diet including Cakes, Sweet Desserts / Pastries, Ice Cream, Soda (diet and regular), Sweet Tea, Candies, Chips, Cookies, Store Bought Juices, Alcohol in Excess of  1-2 drinks a day, Artificial Sweeteners, and "Sugar-free" Products. This will help patient to have stable blood glucose profile and potentially avoid unintended weight gain.   - I encouraged the patient to switch to  unprocessed or minimally processed complex starch and increased protein intake (animal or plant source), fruits, and vegetables.  - Patient is advised to stick to a routine mealtimes to eat 3 meals  a day and avoid unnecessary snacks ( to snack only to correct hypoglycemia).   - I have approached patient with the following individualized plan to manage diabetes and patient agrees:    -Prior to his last visit, he has been responding to basal insulin.  I advised him to increase his [place]  to 60 units nightly associated with strict monitoring of blood glucose 4 times a day-before meals and at bedtime and return in 4 weeks with his meter and logs.   -He may require intensive treatment with basal/bolus insulin in order for him to achieve and maintain control of diabetes to target. - He is advised to call if he registers  blood glucose readings above 200 mg/dL fasting. -He will continue metformin 1000 mg p.o. twice daily. - Patient specific target  A1c;  LDL, HDL, Triglycerides, and  Waist Circumference were discussed in detail.  2) BP/HTN: His blood pressure is controlled to target.  He is advised to continue his current blood pressure medications including lisinopril/HCTZ 20/25 mg p.o. daily.   3) Lipids/HPL:  Lipid levels unknown, he is not on statins. I will obtain fasting lipid panel on subsequent visits. 4)  Weight/Diet: His BMI 75-QGBEEF complicating his diabetes care.  CDE Consult has been  initiated , exercise, and detailed carbohydrates information provided.  5) Chronic Care/Health Maintenance:  -Patient is started on ACEI medications and encouraged to continue to follow up with Ophthalmology, Podiatrist at least yearly or according to recommendations, and advised to stay away from smoking.  I have recommended yearly flu vaccine and pneumonia vaccination at least every 5 years; moderate intensity exercise for up to 150 minutes weekly; and  sleep for at least 7 hours a day.   - I advised patient to maintain close follow up with Glenda Chroman, MD for primary care needs.  - Time spent with the patient: 25 min, of which >50% was spent in reviewing his blood glucose logs , discussing his hypo- and hyper-glycemic episodes, reviewing his current and  previous labs and insulin doses and developing a plan to avoid hypo- and hyper-glycemia. Please refer to Patient Instructions for Blood Glucose Monitoring and Insulin/Medications Dosing Guide"  in media tab for additional  information. Stanley Ramos participated in the discussions, expressed understanding, and voiced agreement with the above plans.  All questions were answered to his satisfaction. he is encouraged to contact clinic should he have any questions or concerns prior to his return visit.    Follow up plan: - Return in about 4 weeks (around 05/22/2018) for Follow up with Meter and Logs Only - no Labs.  Stanley Lloyd, MD Phone: [phone]  Fax: [phone]   04/24/2018, 3:53 PM

## 2018-04-29 ENCOUNTER — Telehealth: Payer: Self-pay

## 2018-04-29 NOTE — Telephone Encounter (Signed)
Advise him to increase Tresiba to 70 units every morning, continue metformin 1000 mg p.o. twice daily.

## 2018-04-29 NOTE — Telephone Encounter (Signed)
Pt states he has had high BG readings.   Date Before breakfast Before lunch Before supper Bedtime  9/9 248 225 250 276  9/10 187 176                 Pt did not have all BG readings at the time but he states that on 9/7 & 9/8 his BG readings were all in the 200's   Pt taking: tresiba 60 units qam , MTF 1000mg  bid

## 2018-04-30 NOTE — Telephone Encounter (Signed)
Pt.notified

## 2018-05-22 ENCOUNTER — Encounter: Payer: Self-pay | Admitting: "Endocrinology

## 2018-05-22 ENCOUNTER — Ambulatory Visit (INDEPENDENT_AMBULATORY_CARE_PROVIDER_SITE_OTHER): Payer: BLUE CROSS/BLUE SHIELD | Admitting: "Endocrinology

## 2018-05-22 VITALS — BP 140/86 | HR 76 | Ht 75.0 in | Wt 309.0 lb

## 2018-05-22 DIAGNOSIS — I10 Essential (primary) hypertension: Secondary | ICD-10-CM | POA: Diagnosis not present

## 2018-05-22 DIAGNOSIS — E118 Type 2 diabetes mellitus with unspecified complications: Secondary | ICD-10-CM | POA: Diagnosis not present

## 2018-05-22 DIAGNOSIS — E1165 Type 2 diabetes mellitus with hyperglycemia: Secondary | ICD-10-CM

## 2018-05-22 DIAGNOSIS — IMO0002 Reserved for concepts with insufficient information to code with codable children: Secondary | ICD-10-CM

## 2018-05-22 DIAGNOSIS — E559 Vitamin D deficiency, unspecified: Secondary | ICD-10-CM | POA: Insufficient documentation

## 2018-05-22 MED ORDER — INSULIN DEGLUDEC 100 UNIT/ML ~~LOC~~ SOPN: 80.0000 [IU] | PEN_INJECTOR | Freq: Every day | SUBCUTANEOUS | 2 refills | Status: DC

## 2018-05-22 MED ORDER — VITAMIN D (ERGOCALCIFEROL) 1.25 MG (50000 UNIT) PO CAPS: 50000.0000 [IU] | ORAL_CAPSULE | ORAL | 0 refills | Status: AC

## 2018-05-22 NOTE — Patient Instructions (Signed)

## 2018-05-22 NOTE — Progress Notes (Signed)
Endocrinology follow-up note   Subjective:    Patient ID: Stanley Ramos, male    DOB: [birth year]. Patient is being seen in follow up for management of currently uncontrolled type 2 diabetes, hypertension, obesity. PMD:   Glenda Chroman, MD  Past Medical History:  Diagnosis Date  . Diabetes mellitus without complication ([place])   . DM (diabetes mellitus) ([place])   . Hypertension    Past Surgical History:  Procedure Laterality Date  . NO PAST SURGERIES     Social History   Socioeconomic History  . Marital status: Single    Spouse name: Not on file  . Number of children: Not on file  . Years of education: Not on file  . Highest education level: Not on file  Occupational History  . Not on file  Social Needs  . Financial resource strain: Not on file  . Food insecurity:    Worry: Not on file    Inability: Not on file  . Transportation needs:    Medical: Not on file    Non-medical: Not on file  Tobacco Use  . Smoking status: Former Smoker    Years: 0.50    Types: Cigarettes    Last attempt to quit: 09/02/2011    Years since quitting: 6.7  . Smokeless tobacco: Never Used  Substance and Sexual Activity  . Alcohol use: Yes    Comment: social  . Drug use: No  . Sexual activity: Not on file  Lifestyle  . Physical activity:    Days per week: Not on file    Minutes per session: Not on file  . Stress: Not on file  Relationships  . Social connections:    Talks on phone: Not on file    Gets together: Not on file    Attends religious service: Not on file    Active member of club or organization: Not on file    Attends meetings of clubs or organizations: Not on file    Relationship status: Not on file  Other Topics Concern  . Not on file  Social History Narrative   ** Merged History Encounter **       Single; student; gets regular exercise.    Outpatient Encounter Medications as of 05/22/2018  Medication Sig  . Blood Glucose Monitoring Suppl (ACCU-CHEK GUIDE)  w/Device KIT 1 Piece by Does not apply route as directed.  . insulin degludec (TRESIBA FLEXTOUCH) 100 UNIT/ML SOPN FlexTouch Pen Inject 0.8 mLs (80 Units total) into the skin at bedtime.  . Insulin Pen Needle (B-D ULTRAFINE III SHORT PEN) 31G X 8 MM MISC 1 each by Does not apply route as directed.  . metFORMIN (GLUCOPHAGE) 1000 MG tablet Take 1 tablet (1,000 mg total) by mouth 2 (two) times daily with a meal.  . Vitamin D, Ergocalciferol, (DRISDOL) 50000 units CAPS capsule Take 1 capsule (50,000 Units total) by mouth every 7 (seven) days.  . [DISCONTINUED] insulin degludec (TRESIBA FLEXTOUCH) 100 UNIT/ML SOPN FlexTouch Pen Inject 0.6 mLs (60 Units total) into the skin daily at 10 pm. (Patient taking differently: Inject 70 Units into the skin daily at 10 pm. )   No facility-administered encounter medications on file as of 05/22/2018.    ALLERGIES: Allergies  Allergen Reactions  . Penicillins Other (See Comments)    unknown   VACCINATION STATUS: Immunization History  Administered Date(s) Administered  . Tdap 06/18/2012    Diabetes  He presents for his follow-up diabetic visit. He has type 2 diabetes  mellitus. Onset time: He was diagnosed at age 41 years. He is high school weight was 320 pounds. He was likely misdiagnosed as type I and was treated with various cognitions of insulin over the years. His disease course has been worsening. There are no hypoglycemic associated symptoms. Pertinent negatives for hypoglycemia include no headaches, seizures or tremors. Associated symptoms include blurred vision. Pertinent negatives for diabetes include no chest pain, no polydipsia, no polyuria and no visual change. There are no hypoglycemic complications. Symptoms are improving. There are no diabetic complications. (Morbidly obese, chronic heavy smoker, sedentary life. ) Risk factors for coronary artery disease include diabetes mellitus, dyslipidemia, family history, male sex, obesity, tobacco exposure,  sedentary lifestyle and hypertension. When asked about current treatments, none (Since there was interruption in his insurance, he did not take any therapy for diabetes for 3 weeks.) were reported. His weight is increasing steadily. He is following a generally unhealthy diet. When asked about meal planning, he reported none. He has had a previous visit with a dietitian. He participates in exercise intermittently. His breakfast blood glucose range is generally 180-200 mg/dl. His lunch blood glucose range is generally >200 mg/dl. His dinner blood glucose range is generally >200 mg/dl. His bedtime blood glucose range is generally >200 mg/dl. His overall blood glucose range is >200 mg/dl. (Patient came with slightly better, still significantly above target glycemic profile.     His recent A1c is 10% unchanged from his last visit A1c of 9.6%.    ) An ACE inhibitor/angiotensin II receptor blocker is not being taken. He does not see a podiatrist.Eye exam is not current.  Hypertension  This is a chronic problem. The current episode started more than 1 year ago. The problem is uncontrolled. Associated symptoms include blurred vision. Pertinent negatives include no chest pain, headaches, palpitations or shortness of breath. Risk factors for coronary artery disease include diabetes mellitus, obesity, male gender, family history, smoking/tobacco exposure and sedentary lifestyle. Past treatments include ACE inhibitors (He is not on any antihypertensive medications at this point.). Compliance problems include diet.      Review of Systems  Constitutional: Negative for chills and fever.  Eyes: Positive for blurred vision.  Respiratory: Negative for cough and shortness of breath.   Cardiovascular: Negative for chest pain and palpitations.  Gastrointestinal: Negative for abdominal pain, diarrhea, nausea and vomiting.  Endocrine: Negative for polydipsia and polyuria.  Genitourinary: Negative for frequency, hematuria  and urgency.  Musculoskeletal: Negative for myalgias.  Skin: Negative for rash.       Has extensive acanthosis nigricans, on his neck, armpits, groins, and joints.  Allergic/Immunologic: Negative for environmental allergies.  Neurological: Negative for tremors, seizures and headaches.  Psychiatric/Behavioral: Negative for hallucinations and suicidal ideas.    Objective:    BP 140/86   Pulse 76   Ht '6\' 3"'$  (1.905 m)   Wt (!) 309 lb (140.2 kg)   BMI 38.62 kg/m   Wt Readings from Last 3 Encounters:  05/22/18 (!) 309 lb (140.2 kg)  04/24/18 (!) 307 lb (139.3 kg)  12/30/17 (!) 306 lb (138.8 kg)    Physical Exam  Constitutional: He is oriented to person, place, and time. He appears well-developed. He is cooperative. No distress.  HENT:  Head: Normocephalic and atraumatic.  Eyes: EOM are normal.  Neck: Normal range of motion. Neck supple. No tracheal deviation present. No thyromegaly present.  Cardiovascular: Normal rate, S1 normal, S2 normal and normal heart sounds. Exam reveals no gallop.  No murmur heard.  Pulses:      Dorsalis pedis pulses are 1+ on the right side, and 1+ on the left side.       Posterior tibial pulses are 1+ on the right side, and 1+ on the left side.  Pulmonary/Chest: Breath sounds normal. No respiratory distress. He has no wheezes.  Abdominal: Soft. Bowel sounds are normal. He exhibits no distension. There is no tenderness. There is no guarding and no CVA tenderness.  Musculoskeletal: He exhibits no edema.       Right shoulder: He exhibits no swelling and no deformity.  Neurological: He is alert and oriented to person, place, and time. He has normal strength and normal reflexes. No cranial nerve deficit or sensory deficit. Gait normal.  Skin: Skin is warm and dry. No rash noted. No cyanosis. Nails show no clubbing.  Acanthosis nigricans. + Skin tags.  Psychiatric: He has a normal mood and affect. His speech is normal. Cognition and memory are normal.     CMP     Component Value Date/Time   NA 136 04/01/2018 1037   K 4.2 04/01/2018 1037   CL 103 04/01/2018 1037   CO2 26 04/01/2018 1037   GLUCOSE 258 (H) 04/01/2018 1037   BUN 16 04/01/2018 1037   CREATININE 1.01 04/01/2018 1037   CALCIUM 9.5 04/01/2018 1037   PROT 6.7 04/01/2018 1037   ALBUMIN 3.8 06/18/2012 0543   AST 27 04/01/2018 1037   ALT 51 (H) 04/01/2018 1037   ALKPHOS 95 06/18/2012 0543   BILITOT 0.5 04/01/2018 1037   GFRNONAA 102 04/01/2018 1037   GFRAA 118 04/01/2018 1037     Diabetic Labs (most recent): Lab Results  Component Value Date   HGBA1C 10.0 (H) 04/01/2018   HGBA1C 9.6 (H) 12/26/2017   HGBA1C 11.1 09/17/2017     Assessment & Plan:   1. Uncontrolled type 2 diabetes mellitus with complication, without long-term current use of insulin ([place])  - Patient has currently uncontrolled symptomatic type 2 DM since  27 years of age. -He presents with improving but still significantly above target glycemic profile.  His recent A1c was 10%.    His diabetes is complicated by history of nonadherence to medical regimen and morbid obesity and patient remains at a high risk for more acute and chronic complications of diabetes which include CAD, CVA, CKD, retinopathy, and neuropathy. These are all discussed in detail with the patient.  - I have counseled the patient on diet management and weight loss, by adopting a carbohydrate restricted/protein rich diet.  -  Suggestion is made for him to avoid simple carbohydrates  from his diet including Cakes, Sweet Desserts / Pastries, Ice Cream, Soda (diet and regular), Sweet Tea, Candies, Chips, Cookies, Store Bought Juices, Alcohol in Excess of  1-2 drinks a day, Artificial Sweeteners, and "Sugar-free" Products. This will help patient to have stable blood glucose profile and potentially avoid unintended weight gain.  - I encouraged the patient to switch to  unprocessed or minimally processed complex starch and increased protein  intake (animal or plant source), fruits, and vegetables.  - Patient is advised to stick to a routine mealtimes to eat 3 meals  a day and avoid unnecessary snacks ( to snack only to correct hypoglycemia).   - I have approached patient with the following individualized plan to manage diabetes and patient agrees:    -Prior to his last visit, he has been responding to basal insulin.  I advised him to increase his [place] to 60 units  nightly associated with strict monitoring of blood glucose 4 times a day-before meals and at bedtime and return in 4 weeks with his meter and logs.   -He may require intensive treatment with basal/bolus insulin in order for him to achieve and maintain control of diabetes to target. - He is advised to call if he registers blood glucose readings above 200 mg/dL fasting. -He will continue metformin 1000 mg p.o. twice daily. - Patient specific target  A1c;  LDL, HDL, Triglycerides, and  Waist Circumference were discussed in detail.  2) BP/HTN: His blood pressure is controlled to target.  He is advised to continue his current blood pressure medications including lisinopril/HCTZ 20/25 mg p.o. daily.   3) Lipids/HPL:  Lipid levels unknown, he is not on statins. I will obtain fasting lipid panel on subsequent visits. 4)  Weight/Diet: His BMI 38- clearly  complicating his diabetes care.  I had a long discussion about the fact that loss of 5-10% of his current body weight will have the most impact in his diabetes care.    CDE Consult has been  initiated , exercise, and detailed carbohydrates information provided.  5) Chronic Care/Health Maintenance:  -Patient is started on ACEI medications and encouraged to continue to follow up with Ophthalmology, Podiatrist at least yearly or according to recommendations, and advised to stay away from smoking.  I have recommended yearly flu vaccine and pneumonia vaccination at least every 5 years; moderate intensity exercise for up to 150  minutes weekly; and  sleep for at least 7 hours a day.   - I advised patient to maintain close follow up with Glenda Chroman, MD for primary care needs.  - Time spent with the patient: 25 min, of which >50% was spent in reviewing his blood glucose logs , discussing his hypo- and hyper-glycemic episodes, reviewing his current and  previous labs and insulin doses and developing a plan to avoid hypo- and hyper-glycemia. Please refer to Patient Instructions for Blood Glucose Monitoring and Insulin/Medications Dosing Guide"  in media tab for additional information. Hunt Oris Brodersen participated in the discussions, expressed understanding, and voiced agreement with the above plans.  All questions were answered to his satisfaction. he is encouraged to contact clinic should he have any questions or concerns prior to his return visit.    Follow up plan: - Return in about 9 weeks (around 07/24/2018) for Follow up with Pre-visit Labs, Meter, and Logs.  Glade Lloyd, MD Phone: [phone]  Fax: [phone]   05/22/2018, 1:11 PM

## 2018-07-24 ENCOUNTER — Ambulatory Visit: Payer: BLUE CROSS/BLUE SHIELD | Admitting: "Endocrinology

## 2018-08-19 LAB — COMPLETE METABOLIC PANEL WITH GFR
AG RATIO: 1.6 (calc) (ref 1.0–2.5)
ALBUMIN MSPROF: 4.4 g/dL (ref 3.6–5.1)
ALT: 33 U/L (ref 9–46)
AST: 21 U/L (ref 10–40)
Alkaline phosphatase (APISO): 60 U/L (ref 40–115)
BILIRUBIN TOTAL: 0.7 mg/dL (ref 0.2–1.2)
BUN: 13 mg/dL (ref 7–25)
CALCIUM: 9.7 mg/dL (ref 8.6–10.3)
CO2: 28 mmol/L (ref 20–32)
Chloride: 102 mmol/L (ref 98–110)
Creat: 1.09 mg/dL (ref 0.60–1.35)
GFR, EST AFRICAN AMERICAN: 107 mL/min/{1.73_m2} (ref >=60)
GFR, EST NON AFRICAN AMERICAN: 93 mL/min/{1.73_m2} (ref >=60)
GLOBULIN: 2.8 g/dL (ref 1.9–3.7)
Glucose, Bld: 235 mg/dL — ABNORMAL HIGH (ref 65–139)
POTASSIUM: 4.1 mmol/L (ref 3.5–5.3)
SODIUM: 138 mmol/L (ref 135–146)
TOTAL PROTEIN: 7.2 g/dL (ref 6.1–8.1)

## 2018-08-19 LAB — VITAMIN D 25 HYDROXY (VIT D DEFICIENCY, FRACTURES): Vit D, 25-Hydroxy: 29 ng/mL — ABNORMAL LOW (ref 30–100)

## 2018-08-19 LAB — HEMOGLOBIN A1C
Hgb A1c MFr Bld: 9.1 % of total Hgb — ABNORMAL HIGH (ref <5.7)
Mean Plasma Glucose: 214 (calc)
eAG (mmol/L): 11.9 (calc)

## 2018-08-29 ENCOUNTER — Encounter: Payer: Self-pay | Admitting: "Endocrinology

## 2018-08-29 ENCOUNTER — Ambulatory Visit (INDEPENDENT_AMBULATORY_CARE_PROVIDER_SITE_OTHER): Payer: BLUE CROSS/BLUE SHIELD | Admitting: "Endocrinology

## 2018-08-29 VITALS — BP 137/85 | HR 82 | Ht 75.0 in | Wt 308.0 lb

## 2018-08-29 DIAGNOSIS — I10 Essential (primary) hypertension: Secondary | ICD-10-CM | POA: Diagnosis not present

## 2018-08-29 DIAGNOSIS — E559 Vitamin D deficiency, unspecified: Secondary | ICD-10-CM | POA: Diagnosis not present

## 2018-08-29 DIAGNOSIS — E118 Type 2 diabetes mellitus with unspecified complications: Secondary | ICD-10-CM | POA: Diagnosis not present

## 2018-08-29 DIAGNOSIS — E1165 Type 2 diabetes mellitus with hyperglycemia: Secondary | ICD-10-CM

## 2018-08-29 DIAGNOSIS — IMO0002 Reserved for concepts with insufficient information to code with codable children: Secondary | ICD-10-CM

## 2018-08-29 MED ORDER — INSULIN DEGLUDEC 200 UNIT/ML ~~LOC~~ SOPN: 100.0000 [IU] | PEN_INJECTOR | Freq: Every day | SUBCUTANEOUS | 3 refills | Status: AC

## 2018-08-29 NOTE — Progress Notes (Signed)
Endocrinology follow-up note   Subjective:    Patient ID: Stanley Ramos, male    DOB: 10/02/1990. Patient is being seen in follow up for management of currently uncontrolled type 2 diabetes, hypertension, obesity. PMD:   Glenda Chroman, MD  Past Medical History:  Diagnosis Date  . Diabetes mellitus without complication ([place])   . DM (diabetes mellitus) ([place])   . Hypertension    Past Surgical History:  Procedure Laterality Date  . NO PAST SURGERIES     Social History   Socioeconomic History  . Marital status: Single    Spouse name: Not on file  . Number of children: Not on file  . Years of education: Not on file  . Highest education level: Not on file  Occupational History  . Not on file  Social Needs  . Financial resource strain: Not on file  . Food insecurity:    Worry: Not on file    Inability: Not on file  . Transportation needs:    Medical: Not on file    Non-medical: Not on file  Tobacco Use  . Smoking status: Former Smoker    Years: 0.50    Types: Cigarettes    Last attempt to quit: 09/02/2011    Years since quitting: 6.9  . Smokeless tobacco: Never Used  Substance and Sexual Activity  . Alcohol use: Yes    Comment: social  . Drug use: No  . Sexual activity: Not on file  Lifestyle  . Physical activity:    Days per week: Not on file    Minutes per session: Not on file  . Stress: Not on file  Relationships  . Social connections:    Talks on phone: Not on file    Gets together: Not on file    Attends religious service: Not on file    Active member of club or organization: Not on file    Attends meetings of clubs or organizations: Not on file    Relationship status: Not on file  Other Topics Concern  . Not on file  Social History Narrative   ** Merged History Encounter **       Single; student; gets regular exercise.    Outpatient Encounter Medications as of 08/29/2018  Medication Sig  . Insulin Degludec (TRESIBA FLEXTOUCH) 200 UNIT/ML SOPN  Inject 100 Units into the skin at bedtime.  . [DISCONTINUED] Insulin Degludec (TRESIBA FLEXTOUCH) 200 UNIT/ML SOPN Inject 80 Units into the skin at bedtime.  . Blood Glucose Monitoring Suppl (ACCU-CHEK GUIDE) w/Device KIT 1 Piece by Does not apply route as directed.  . Insulin Pen Needle (B-D ULTRAFINE III SHORT PEN) 31G X 8 MM MISC 1 each by Does not apply route as directed.  . metFORMIN (GLUCOPHAGE) 1000 MG tablet Take 1 tablet (1,000 mg total) by mouth 2 (two) times daily with a meal.  . Vitamin D, Ergocalciferol, (DRISDOL) 50000 units CAPS capsule Take 1 capsule (50,000 Units total) by mouth every 7 (seven) days.  . [DISCONTINUED] insulin degludec (TRESIBA FLEXTOUCH) 100 UNIT/ML SOPN FlexTouch Pen Inject 0.8 mLs (80 Units total) into the skin at bedtime.   No facility-administered encounter medications on file as of 08/29/2018.    ALLERGIES: Allergies  Allergen Reactions  . Penicillins Other (See Comments)    unknown   VACCINATION STATUS: Immunization History  Administered Date(s) Administered  . Tdap 06/18/2012    Diabetes  He presents for his follow-up diabetic visit. He has type 2 diabetes mellitus. Onset time:  He was diagnosed at age 89 years. He is high school weight was 320 pounds. He was likely misdiagnosed as type I and was treated with various cognitions of insulin over the years. His disease course has been improving. There are no hypoglycemic associated symptoms. Pertinent negatives for hypoglycemia include no headaches, seizures or tremors. Pertinent negatives for diabetes include no blurred vision, no chest pain, no polydipsia, no polyuria and no visual change. There are no hypoglycemic complications. Symptoms are improving. There are no diabetic complications. (Morbidly obese, chronic heavy smoker, sedentary life. ) Risk factors for coronary artery disease include diabetes mellitus, dyslipidemia, family history, male sex, obesity, tobacco exposure, sedentary lifestyle and  hypertension. When asked about current treatments, none (Since there was interruption in his insurance, he did not take any therapy for diabetes for 3 weeks.) were reported. His weight is fluctuating minimally. He is following a generally unhealthy diet. When asked about meal planning, he reported none. He has had a previous visit with a dietitian. He participates in exercise intermittently. His breakfast blood glucose range is generally 180-200 mg/dl. His bedtime blood glucose range is generally 180-200 mg/dl. His overall blood glucose range is 180-200 mg/dl. (He returns with slight improvement in his glycemic profile and A1c of 9.1%.    ) An ACE inhibitor/angiotensin II receptor blocker is not being taken. He does not see a podiatrist.Eye exam is not current.  Hypertension  This is a chronic problem. The current episode started more than 1 year ago. The problem is uncontrolled. Pertinent negatives include no blurred vision, chest pain, headaches, palpitations or shortness of breath. Risk factors for coronary artery disease include diabetes mellitus, obesity, male gender, family history, smoking/tobacco exposure and sedentary lifestyle. Past treatments include ACE inhibitors (He is not on any antihypertensive medications at this point.). Compliance problems include diet.      Review of Systems  Constitutional: Negative for chills and fever.  Eyes: Negative for blurred vision.  Respiratory: Negative for cough and shortness of breath.   Cardiovascular: Negative for chest pain and palpitations.  Gastrointestinal: Negative for abdominal pain, diarrhea, nausea and vomiting.  Endocrine: Negative for polydipsia and polyuria.  Genitourinary: Negative for frequency, hematuria and urgency.  Musculoskeletal: Negative for myalgias.  Skin: Negative for rash.       Has extensive acanthosis nigricans, on his neck, armpits, groins, and joints.  Allergic/Immunologic: Negative for environmental allergies.   Neurological: Negative for tremors, seizures and headaches.  Psychiatric/Behavioral: Negative for hallucinations and suicidal ideas.    Objective:    BP 137/85   Pulse 82   Ht _0  (1.905 m)   Wt (!) 308 lb (139.7 kg)   BMI 38.50 kg/m   Wt Readings from Last 3 Encounters:  08/29/18 (!) 308 lb (139.7 kg)  05/22/18 (!) 309 lb (140.2 kg)  04/24/18 (!) 307 lb (139.3 kg)    Physical Exam  Constitutional: He is oriented to person, place, and time. He appears well-developed. He is cooperative. No distress.  HENT:  Head: Normocephalic and atraumatic.  Eyes: EOM are normal.  Neck: Normal range of motion. Neck supple. No tracheal deviation present. No thyromegaly present.  Cardiovascular: Normal rate, S1 normal, S2 normal and normal heart sounds. Exam reveals no gallop.  No murmur heard. Pulses:      Dorsalis pedis pulses are 1+ on the right side and 1+ on the left side.       Posterior tibial pulses are 1+ on the right side and 1+ on the left  side.  Pulmonary/Chest: Breath sounds normal. No respiratory distress. He has no wheezes.  Abdominal: Soft. Bowel sounds are normal. He exhibits no distension. There is no abdominal tenderness. There is no guarding and no CVA tenderness.  Musculoskeletal:        General: No edema.     Right shoulder: He exhibits no swelling and no deformity.  Neurological: He is alert and oriented to person, place, and time. He has normal strength and normal reflexes. No cranial nerve deficit or sensory deficit. Gait normal.  Skin: Skin is warm and dry. No rash noted. No cyanosis. Nails show no clubbing.  Acanthosis nigricans. + Skin tags.  Psychiatric: He has a normal mood and affect. His speech is normal. Cognition and memory are normal.    CMP     Component Value Date/Time   NA 138 08/18/2018 0912   K 4.1 08/18/2018 0912   CL 102 08/18/2018 0912   CO2 28 08/18/2018 0912   GLUCOSE 235 (H) 08/18/2018 0912   BUN 13 08/18/2018 0912   CREATININE 1.09  08/18/2018 0912   CALCIUM 9.7 08/18/2018 0912   PROT 7.2 08/18/2018 0912   ALBUMIN 3.8 06/18/2012 0543   AST 21 08/18/2018 0912   ALT 33 08/18/2018 0912   ALKPHOS 95 06/18/2012 0543   BILITOT 0.7 08/18/2018 0912   GFRNONAA 93 08/18/2018 0912   GFRAA 107 08/18/2018 0912     Diabetic Labs (most recent): Lab Results  Component Value Date   HGBA1C 9.1 (H) 08/18/2018   HGBA1C 10.0 (H) 04/01/2018   HGBA1C 9.6 (H) 12/26/2017     Assessment & Plan:   1. Uncontrolled type 2 diabetes mellitus with complication, without long-term current use of insulin ([place])  - Patient has currently uncontrolled symptomatic type 2 DM since  28 years of age. -He presents with right improvement in his glycemic profile A1c of 9.1% improving from 10%.     His diabetes is complicated by history of nonadherence to medical regimen and morbid obesity and patient remains at a high risk for more acute and chronic complications of diabetes which include CAD, CVA, CKD, retinopathy, and neuropathy. These are all discussed in detail with the patient.  - I have counseled the patient on diet management and weight loss, by adopting a carbohydrate restricted/protein rich diet.  -  Suggestion is made for him to avoid simple carbohydrates  from his diet including Cakes, Sweet Desserts / Pastries, Ice Cream, Soda (diet and regular), Sweet Tea, Candies, Chips, Cookies, Store Bought Juices, Alcohol in Excess of  1-2 drinks a day, Artificial Sweeteners, and "Sugar-free" Products. This will help patient to have stable blood glucose profile and potentially avoid unintended weight gain.   - I encouraged the patient to switch to  unprocessed or minimally processed complex starch and increased protein intake (animal or plant source), fruits, and vegetables.  - Patient is advised to stick to a routine mealtimes to eat 3 meals  a day and avoid unnecessary snacks ( to snack only to correct hypoglycemia).   - I have approached patient  with the following individualized plan to manage diabetes and patient agrees:    -Patient may require intensive treatment with basal/bolus insulin in order for him to achieve and maintain control of diabetes to target.  However due to his great hesitance and difficulty engaging he will continue to benefit from simplified treatment.  There is some room on his basal insulin before adding prandial insulin.   -I discussed and increase his  Tresiba to 100 units nightly associated with strict monitoring of blood glucose at least 2 times a day- daily before breakfast and at bedtime.    -He will continue metformin 1000 mg p.o. twice daily. - Patient specific target  A1c;  LDL, HDL, Triglycerides, and  Waist Circumference were discussed in detail.  2) BP/HTN: His blood pressure is controlled to near target.   He is advised to continue his current blood pressure medications including lisinopril/HCTZ 20/25 mg p.o. daily.   3) Lipids/HPL:  Lipid levels unknown, he is not on statins.  Will be considered for  fasting lipid panel on subsequent visits. 4)  Weight/Diet: His BMI 38- clearly  complicating his diabetes care.  I had a long discussion about the fact that loss of 5-10% of his current body weight will have the most impact in his diabetes care.    CDE Consult has been  initiated , exercise, and detailed carbohydrates information provided.  5) Chronic Care/Health Maintenance:  -Patient is started on ACEI medications and encouraged to continue to follow up with Ophthalmology, Podiatrist at least yearly or according to recommendations, and advised to stay away from smoking.  I have recommended yearly flu vaccine and pneumonia vaccination at least every 5 years; moderate intensity exercise for up to 150 minutes weekly; and  sleep for at least 7 hours a day.   - I advised patient to maintain close follow up with Glenda Chroman, MD for primary care needs.  - Time spent with the patient: 25 min, of which >50%  was spent in reviewing his blood glucose logs , discussing his hypo- and hyper-glycemic episodes, reviewing his current and  previous labs and insulin doses and developing a plan to avoid hypo- and hyper-glycemia. Please refer to Patient Instructions for Blood Glucose Monitoring and Insulin/Medications Dosing Guide"  in media tab for additional information. Hunt Oris Hoog participated in the discussions, expressed understanding, and voiced agreement with the above plans.  All questions were answered to his satisfaction. he is encouraged to contact clinic should he have any questions or concerns prior to his return visit.   Follow up plan: - Return in about 4 months (around 12/28/2018) for Follow up with Pre-visit Labs, Meter, and Logs.  Glade Lloyd, MD Phone: [phone]  Fax: [phone]   08/29/2018, 9:42 AM

## 2018-08-29 NOTE — Patient Instructions (Signed)

## 2018-12-29 ENCOUNTER — Ambulatory Visit: Payer: BLUE CROSS/BLUE SHIELD | Admitting: "Endocrinology
